# Patient Record
Sex: Female | Born: 1935
Health system: Southern US, Community
[De-identification: ages and names within clinical notes are randomized; demographics above are authoritative.]

## PROBLEM LIST (undated history)

## (undated) DIAGNOSIS — M542 Cervicalgia: Secondary | ICD-10-CM

## (undated) DIAGNOSIS — K589 Irritable bowel syndrome without diarrhea: Secondary | ICD-10-CM

## (undated) DIAGNOSIS — Z85828 Personal history of other malignant neoplasm of skin: Secondary | ICD-10-CM

## (undated) DIAGNOSIS — Z853 Personal history of malignant neoplasm of breast: Secondary | ICD-10-CM

## (undated) DIAGNOSIS — M810 Age-related osteoporosis without current pathological fracture: Secondary | ICD-10-CM

## (undated) DIAGNOSIS — I10 Essential (primary) hypertension: Secondary | ICD-10-CM

## (undated) DIAGNOSIS — R7303 Prediabetes: Secondary | ICD-10-CM

## (undated) DIAGNOSIS — E785 Hyperlipidemia, unspecified: Secondary | ICD-10-CM

## (undated) DIAGNOSIS — M1990 Unspecified osteoarthritis, unspecified site: Secondary | ICD-10-CM

## (undated) DIAGNOSIS — K2270 Barrett's esophagus without dysplasia: Secondary | ICD-10-CM

## (undated) HISTORY — DX: Irritable bowel syndrome, unspecified: K58.9

## (undated) HISTORY — PX: MASTECTOMY: SHX3

## (undated) HISTORY — DX: Personal history of other malignant neoplasm of skin: Z85.828

## (undated) HISTORY — PX: OTHER SURGICAL HISTORY: SHX169

## (undated) HISTORY — DX: Cervicalgia: M54.2

## (undated) HISTORY — DX: Hyperlipidemia, unspecified: E78.5

## (undated) HISTORY — PX: CATARACT EXTRACTION: SUR2

## (undated) HISTORY — DX: Prediabetes: R73.03

## (undated) HISTORY — DX: Age-related osteoporosis without current pathological fracture: M81.0

## (undated) HISTORY — PX: CHOLECYSTECTOMY: SHX55

## (undated) HISTORY — DX: Unspecified osteoarthritis, unspecified site: M19.90

## (undated) HISTORY — DX: Personal history of malignant neoplasm of breast: Z85.3

## (undated) HISTORY — PX: ABDOMINAL HYSTERECTOMY: SHX81

## (undated) HISTORY — PX: APPENDECTOMY: SHX54

## (undated) HISTORY — DX: Barrett's esophagus without dysplasia: K22.70

## (undated) HISTORY — PX: NEPHRECTOMY: SHX65

## (undated) HISTORY — DX: Essential (primary) hypertension: I10

---

## 1998-02-19 ENCOUNTER — Other Ambulatory Visit: Admission: RE | Admit: 1998-02-19 | Discharge: 1998-02-19 | Payer: Self-pay | Admitting: Internal Medicine

## 2000-01-09 ENCOUNTER — Encounter (INDEPENDENT_AMBULATORY_CARE_PROVIDER_SITE_OTHER): Payer: Self-pay | Admitting: *Deleted

## 2000-01-09 ENCOUNTER — Ambulatory Visit (HOSPITAL_BASED_OUTPATIENT_CLINIC_OR_DEPARTMENT_OTHER): Admission: RE | Admit: 2000-01-09 | Discharge: 2000-01-09 | Payer: Self-pay | Admitting: Surgery

## 2000-01-09 ENCOUNTER — Encounter: Payer: Self-pay | Admitting: Surgery

## 2000-01-16 ENCOUNTER — Encounter: Admission: RE | Admit: 2000-01-16 | Discharge: 2000-04-15 | Payer: Self-pay | Admitting: Radiation Oncology

## 2000-01-20 ENCOUNTER — Ambulatory Visit (HOSPITAL_BASED_OUTPATIENT_CLINIC_OR_DEPARTMENT_OTHER): Admission: RE | Admit: 2000-01-20 | Discharge: 2000-01-20 | Payer: Self-pay | Admitting: Surgery

## 2000-01-31 ENCOUNTER — Encounter: Payer: Self-pay | Admitting: Surgery

## 2000-02-03 ENCOUNTER — Encounter (INDEPENDENT_AMBULATORY_CARE_PROVIDER_SITE_OTHER): Payer: Self-pay

## 2000-02-03 ENCOUNTER — Inpatient Hospital Stay (HOSPITAL_COMMUNITY): Admission: RE | Admit: 2000-02-03 | Discharge: 2000-02-04 | Payer: Self-pay | Admitting: Surgery

## 2000-05-11 ENCOUNTER — Other Ambulatory Visit: Admission: RE | Admit: 2000-05-11 | Discharge: 2000-05-11 | Payer: Self-pay | Admitting: Surgery

## 2000-05-21 ENCOUNTER — Other Ambulatory Visit: Admission: RE | Admit: 2000-05-21 | Discharge: 2000-05-21 | Payer: Self-pay | Admitting: Internal Medicine

## 2000-07-06 ENCOUNTER — Ambulatory Visit (HOSPITAL_COMMUNITY): Admission: RE | Admit: 2000-07-06 | Discharge: 2000-07-06 | Payer: Self-pay | Admitting: *Deleted

## 2000-07-06 ENCOUNTER — Encounter (INDEPENDENT_AMBULATORY_CARE_PROVIDER_SITE_OTHER): Payer: Self-pay | Admitting: Specialist

## 2001-05-31 ENCOUNTER — Other Ambulatory Visit: Admission: RE | Admit: 2001-05-31 | Discharge: 2001-05-31 | Payer: Self-pay | Admitting: Internal Medicine

## 2001-07-28 ENCOUNTER — Encounter (INDEPENDENT_AMBULATORY_CARE_PROVIDER_SITE_OTHER): Payer: Self-pay | Admitting: Specialist

## 2001-07-28 ENCOUNTER — Ambulatory Visit (HOSPITAL_COMMUNITY): Admission: RE | Admit: 2001-07-28 | Discharge: 2001-07-28 | Payer: Self-pay | Admitting: *Deleted

## 2002-06-02 ENCOUNTER — Other Ambulatory Visit: Admission: RE | Admit: 2002-06-02 | Discharge: 2002-06-02 | Payer: Self-pay | Admitting: Internal Medicine

## 2002-10-03 ENCOUNTER — Ambulatory Visit (HOSPITAL_COMMUNITY): Admission: RE | Admit: 2002-10-03 | Discharge: 2002-10-03 | Payer: Self-pay | Admitting: *Deleted

## 2002-10-03 ENCOUNTER — Encounter (INDEPENDENT_AMBULATORY_CARE_PROVIDER_SITE_OTHER): Payer: Self-pay

## 2002-11-29 ENCOUNTER — Ambulatory Visit (HOSPITAL_COMMUNITY): Admission: RE | Admit: 2002-11-29 | Discharge: 2002-11-29 | Payer: Self-pay | Admitting: Internal Medicine

## 2002-11-29 ENCOUNTER — Encounter: Payer: Self-pay | Admitting: Internal Medicine

## 2003-07-20 ENCOUNTER — Other Ambulatory Visit: Admission: RE | Admit: 2003-07-20 | Discharge: 2003-07-20 | Payer: Self-pay | Admitting: Internal Medicine

## 2004-08-16 ENCOUNTER — Other Ambulatory Visit: Admission: RE | Admit: 2004-08-16 | Discharge: 2004-08-16 | Payer: Self-pay | Admitting: Internal Medicine

## 2005-04-14 ENCOUNTER — Ambulatory Visit (HOSPITAL_COMMUNITY): Admission: RE | Admit: 2005-04-14 | Discharge: 2005-04-14 | Payer: Self-pay | Admitting: *Deleted

## 2005-04-14 ENCOUNTER — Encounter (INDEPENDENT_AMBULATORY_CARE_PROVIDER_SITE_OTHER): Payer: Self-pay | Admitting: Specialist

## 2005-06-25 ENCOUNTER — Ambulatory Visit (HOSPITAL_COMMUNITY): Admission: RE | Admit: 2005-06-25 | Discharge: 2005-06-25 | Payer: Self-pay | Admitting: Surgery

## 2005-06-25 ENCOUNTER — Encounter (INDEPENDENT_AMBULATORY_CARE_PROVIDER_SITE_OTHER): Payer: Self-pay | Admitting: Specialist

## 2006-01-26 ENCOUNTER — Ambulatory Visit (HOSPITAL_COMMUNITY): Admission: RE | Admit: 2006-01-26 | Discharge: 2006-01-26 | Payer: Self-pay | Admitting: *Deleted

## 2007-06-18 ENCOUNTER — Ambulatory Visit (HOSPITAL_COMMUNITY): Admission: RE | Admit: 2007-06-18 | Discharge: 2007-06-18 | Payer: Self-pay | Admitting: *Deleted

## 2007-06-18 ENCOUNTER — Encounter (INDEPENDENT_AMBULATORY_CARE_PROVIDER_SITE_OTHER): Payer: Self-pay | Admitting: *Deleted

## 2007-06-27 ENCOUNTER — Ambulatory Visit: Payer: Self-pay | Admitting: Internal Medicine

## 2007-06-27 ENCOUNTER — Observation Stay (HOSPITAL_COMMUNITY): Admission: EM | Admit: 2007-06-27 | Discharge: 2007-06-27 | Payer: Self-pay | Admitting: Emergency Medicine

## 2009-12-19 ENCOUNTER — Encounter: Admission: RE | Admit: 2009-12-19 | Discharge: 2009-12-19 | Payer: Self-pay | Admitting: Internal Medicine

## 2011-01-09 ENCOUNTER — Other Ambulatory Visit: Payer: Self-pay | Admitting: Gastroenterology

## 2011-04-08 NOTE — H&P (Signed)
NAMEALLANAH, [name]              ACCOUNT NO.:  [id number]   MEDICAL RECORD NO.:  [id number]          PATIENT TYPE:  INP   LOCATION:  1403                         FACILITY:  [facility]   PHYSICIAN:  Gardiner Barefoot, MD    DATE OF BIRTH:  30-Mar-1929   DATE OF ADMISSION:  06/26/2007  DATE OF DISCHARGE:  06/27/2007                              HISTORY & PHYSICAL   PRIMARY CARE PHYSICIAN:  Dr. Pearson Grippe of [facility].   CHIEF COMPLAINT:  Chest pain.   HISTORY OF PRESENT ILLNESS:  This is a 77 year old female with a history  of GERD, hypertension and a remote history of tobacco abuse (quit in  1970) here with chest pain.  The patient reports it started at 9 p.m. in  the evening with acute onset, substernal and radiation to the left  shoulder.  The patient complains of no nausea, vomiting, diaphoresis or  radiation to any other location.  No pallor.  On arrival to the  emergency room, the patient reported only intermittent mild chest pain.  The patient reports, despite having a history of significant GERD,  including hiatal hernia, this particular pain is not similar to her  typical symptoms of GERD and has never experienced this before.   PAST MEDICAL HISTORY:  1. Hypertension.  2. GERD.  3. Hiatal hernia.  4. Osteoporosis.  5. Status post cholecystectomy.  6. Status post hysterectomy.  7. Status post mastectomy for DCIS.  8. Nephrectomy.  9. BTL.  10.Status post EGD on June 18, 2007, which was negative for dysplasia.   MEDICATIONS:  1. Diovan 160 mg daily.  2. Protonix p.o. daily.  3. Fosamax weekly.   DRUG ALLERGIES:  No known drug allergies.   SOCIAL HISTORY:  Former smoker, occasional alcohol, here with her  husband.   FAMILY HISTORY:  No history of early sudden MI in her family.   REVIEW OF SYSTEMS:  All systems reviewed and negative except as per HPI.   PHYSICAL EXAM:  Temperature is 98.2, pulse is 88, respirations 18, blood  pressure is 185/88 and O2 sats 95%  on room air.  GENERAL:  The patient is awake, alert and oriented x3 and appears in no  acute distress.  HEENT:  Anicteric.  CARDIOVASCULAR:  Regular rate and rhythm with no murmurs, rubs or  gallops.  LUNGS:  Clear to auscultation bilaterally.  ABDOMEN:  Soft, nontender, nondistended, positive bowel sounds, no  hepatosplenomegaly.  EXTREMITIES:  No cyanosis, clubbing or edema.   LABORATORY DATA:  CK MB 2.5, troponin less than 0.05, sodium 142,  potassium 3.6, chloride 109, bicarbonate 26, BUN 22, creatinine 1.07,  glucose 120, WBC 8.4, hemoglobin 12 and platelets 176.  Chest x-ray no  acute cardiopulmonary disease.   IMPRESSION:  1. Chest pain.  Will have the patient admitted for serial enzymes, EKG      in the morning and as needed, chest pain protocol and will need a      stress test either in the morning or soon as an outpatient.  For      any change, increase in cardiac markers  or arrhythmias or changes      on the EKG, will consult Cardiology.  2. Hypertension.  Will continue with the patient's Diovan.  3. Gastroesophageal reflux disease.  Will continue the patient's      antireflux medication.      Gardiner Barefoot, MD  Electronically Signed     RWC/MEDQ  D:  06/27/2007  T:  06/27/2007  Job:  [id number]   cc:   Massie Maroon, MD  Fax: [phone]

## 2011-04-08 NOTE — Op Note (Signed)
[name], [name]              ACCOUNT NO.:  [id number]   MEDICAL RECORD NO.:  [id number]          PATIENT TYPE:  AMB   LOCATION:  ENDO                         FACILITY:  [facility]   PHYSICIAN:  Georgiana Spinner, M.D.    DATE OF BIRTH:  14-Nov-1929   DATE OF PROCEDURE:  06/18/2007  DATE OF DISCHARGE:                               OPERATIVE REPORT   PROCEDURE:  Upper endoscopy.   INDICATIONS:  History of Barrett's esophagus and gastroesophageal  reflux.   ANESTHESIA:  Fentanyl 50 mcg, Versed 5 mg.   PROCEDURE IN DETAIL:  With the patient mildly sedated in the left  lateral decubitus position, the Pentax videoscopic endoscope was  inserted in the mouth and passed under direct vision through the  esophagus which appeared normal until we reached the distal esophagus  and the squamocolumnar junction could not be well seen.  So, I entered  into the stomach.  The fundus, body, antrum, duodenal bulb, and second  portion of the duodenum were visualized. From this point, the endoscope  was slowly withdrawn taking circumferential views of the duodenal mucosa  until the endoscope had been pulled back into the stomach and placed in  retroflexion to view the stomach from below. The endoscope was  straightened and withdrawn, taking circumferential views of the  remaining gastric and esophageal mucosa, stopping first in the fundus of  the stomach where a number of polyps were seen and photographed.  Biopsies were taken to sample them, although they looked like a fundic  gland polyps. We then pulled back into the distal esophagus and took  biopsies of areas that could be Barrett's esophagus and we withdrew the  endoscope taking circumferential views of the remaining esophageal  mucosa.  The patient's vital signs and pulse oximeter remained stable.  The patient tolerated the procedure well without apparent complications.   FINDINGS:  No clear cut evidence of Barrett's esophagus.  Biopsies taken  of  the squamocolumnar junction and fundic gland polyps.  Await biopsy  reports.  The patient will call me for results and follow-up with me as  an outpatient.           ______________________________  Georgiana Spinner, M.D.     GMO/MEDQ  D:  06/18/2007  T:  06/18/2007  Job:  [id number]

## 2011-04-11 NOTE — Op Note (Signed)
[facility]  Patient:    [name], [name]                       MRN: [id number] Proc. Date: 01/09/00 Adm. Date:  [id number] Attending:  Charlton Haws CC:         Janae Bridgeman. Eloise Harman., M.D.             [facility]                           Operative Report  CCS#:  [id number]  PREOPERATIVE DIAGNOSIS:  Left breast abnormality on mammogram, possible carcinoma.  POSTOPERATIVE DIAGNOSIS:  Left breast abnormality on mammogram, possible carcinoma - probable carcinoma.  OPERATION:  Needle guided excision of left breast mass.  SURGEON:  Currie Paris, M.D.  ANESTHESIA:  General.  CLINICAL HISTORY:  This patient is a 77 year old, whose recent mammogram shows n area of architectural distortion in the upper outer quadrant of the left breast, which was not palpable.  DESCRIPTION OF PROCEDURE:  Patient brought to the operating room, having had a guidewire placed adjacent and through the mass.  The patient was given general anesthesia and the breast was prepped and draped.  The guidewire entered at the  areolar margin at about the 11 oclock position and tracked superiorly and deep. I made an initial incision on the skin approximately  1.5 cm away from the guidewire entry site, so I had small amount of breast to tunnel through to get to the area in question.  I then began excising the tissue around the guidewire, tracking its approximate direction.  When I got to about 5 cm, I found a very hard area and backed up and took a little bit more tissue around that, trying to stay out of that, but was suspicious that this was carcinoma.  The deep margin of the excision was the chest wall and I oriented the margins for the pathologist.  The area in question was very close the superior margin, so once this was sent or specimen mammography, I went back and took an entirely new deep superior margin all the way from the skin down to the  muscle.  This was all done with cautery and again, the new margin was marked.  The wound was irrigated and hemostasis achieved. Throughout, I put four metal clips in, not to mark the entire excision area, but to put around the area where the tumor had been located, since this was still superior to the incision and I wanted to make sure that area was well localized if this being malignant and radiation therapy be utilized.  A final check was made for hemostasis.  Using 3-0 Vicryl to close the subcu, leaving a fairly significant cavity, which I thought would fill in nicely. Skin was closed with 4-0 Monocryl subcuticular and Steri-Strips.  Sterile dressing was applied.  The patient tolerated the procedure well.  There are no operative complications.  All counts were correct. DD:  01/09/00 TD:  01/09/00 Job: [id number]

## 2011-04-11 NOTE — Procedures (Signed)
[facility]  Patient:    [name], [name]                MRN: [id number] Proc. Date: 07/06/00 Adm. Date:  [id number] Attending:  Sabino Gasser                           Procedure Report  PROCEDURE:  Endoscopy.  ENDOSCOPIST:  Sabino Gasser, M.D.  INDICATIONS:  Rule out Barretts esophagus in a patient with reflux.  ANESTHESIA:  None further given.  DESCRIPTION OF PROCEDURE:  With the patient mildly sedated in the left lateral decubitus position, the Olympus videoscopic endoscope was inserted in the mouth and passed under direct vision through the esophagus into the stomach. The fundus, body, and antrum all were well-visualized and appeared normal. The endoscope was then placed on retroflexion to view the stomach from below and this too appeared normal.  The endoscope was straightened and then advanced through the duodenal bulb and second portion of the duodenum, both of which appeared normal and were photographed.  The endoscope was then slowly withdrawn, taking circumferential views of the entire gastric and subsequently esophageal mucosa, stopping in the distal esophagus to photograph the GE junction.  There was no evidence of Barretts, _____ I could tell there was an area however that looked slightly inflamed, possibly gastritis or possibly even intestinal neoplasia.  This was photographed as well and biopsied.  The endoscope was then withdrawn, taking circumferential views of the remaining esophageal mucosa which otherwise appeared normal.  The patients vital signs and pulse oximeter remained stable.  The patient tolerated the procedure well and without apparent complications.  FINDINGS:  Await biopsy report of distal esophagus and proximal stomach.  The patient will call me for results and follow up with me as an outpatient. DD:  07/06/00 TD:  07/07/00 Job: [id number]

## 2011-04-11 NOTE — Procedures (Signed)
[facility]  Patient:    [name], [name] Visit Number: [id number] MRN: [id number]          Service Type: END Location: ENDO Attending Physician:  Sabino Gasser Proc. Date: 07/28/01 Admit Date:  07/28/2001                             Procedure Report  PROCEDURE:  Upper endoscopy with biopsy.  INDICATIONS:  Barretts esophagus.  ANESTHESIA:  Demerol 40 mg, Versed 5 mg.  DESCRIPTION OF PROCEDURE:  With the patient mildly sedated in the left lateral decubitus position, the Olympus videoscopic endoscope was inserted into the mouth and passed under direct vision through the esophagus which showed changes of Barretts esophagus, photographed in a short segment.  We biopsied this area and other areas of presumptive Barretts esophagus.  We entered into the stomach.  Fundus, body, antrum, duodenal bulb, and second portion of the duodenum were all visualized and appeared normal and were photographed.  From this point, the endoscope was slowly withdrawn, taking circumferential views of the entire duodenal mucosa until the endoscope then pulled back in the stomach and placed in retroflexion to view the stomach from below, and a hiatal hernia was seen and photographed.  The endoscope was then straightened and withdrawn, taking circumferential views of the remaining gastric and esophageal mucosa.  The patients vital signs and pulse oximeter remained stable.  The patient tolerated the procedure well without apparent complications.  FINDINGS:  Hiatal hernia, Barretts esophagus.  PLAN:  Await biopsy report.  The patient will call me for the results and follow up with me as an outpatient. Attending Physician:  Sabino Gasser DD:  07/28/01 TD:  07/28/01 Job: [id number]

## 2011-04-11 NOTE — Op Note (Signed)
   NAME:  [name], [name]                        ACCOUNT NO.:  [id number]   MEDICAL RECORD NO.:  [id number]                   PATIENT TYPE:  AMB   LOCATION:  ENDO                                 FACILITY:  [facility]   PHYSICIAN:  Georgiana Spinner, M.D.                 DATE OF BIRTH:  1929/05/20   DATE OF PROCEDURE:  10/03/2002  DATE OF DISCHARGE:                                 OPERATIVE REPORT   PROCEDURE:  Upper endoscopy with biopsy.   INDICATIONS:  GERD.  Barrett's esophagus.   ANESTHESIA:  Demerol 50, Versed 4 mg.   DESCRIPTION OF PROCEDURE:  With the patient mildly sedated in the left  lateral decubitus position, the Olympus videoscopic endoscope was inserted  in the mouth and passed under direct vision through the esophagus, which  appeared normal, except for a question of Barrett's esophagus that was  photographed and subsequently biopsied.  We then entered into the stomach.  Fundus, body, antrum, duodenal bulb, and second portion of duodenum were  visualized.  From this point, the endoscope was slowly withdrawn, taking  circumferential views of the duodenal mucosa until the endoscope then pulled  back into the stomach, placed in retroflexion to view the stomach from  below, and a hiatal hernia was seen and photographed.  The endoscope was  then straightened and withdrawn, taking circumferential views of the  remaining gastric and esophageal mucosa, stopping in the antrum where there  were some mucosal changes in the prepyloric area that were photographed and  biopsied.  Polyps were seen in the body, and they were photographed and  biopsied.  And, as noted previously, the distal esophagus was biopsied.  The  patient's vital signs and pulse oximeter remained stable.  The patient  tolerated the procedure well without apparent complications.   FINDINGS:  As above, antral mucosal changes, etiology unclear.  Polyps of  the body of the stomach and question of Barrett's.   PLAN:  1.  Await biopsy report.  2. The patient will call me for results and follow up with me as an     outpatient.                                               Georgiana Spinner, M.D.    GMO/MEDQ  D:  10/03/2002  T:  10/03/2002  Job:  [id number]

## 2011-04-11 NOTE — Op Note (Signed)
[name], [name]              ACCOUNT NO.:  [id number]   MEDICAL RECORD NO.:  [id number]          PATIENT TYPE:  OIB   LOCATION:  2899                         FACILITY:  [facility]   PHYSICIAN:  Currie Paris, M.D.DATE OF BIRTH:  1928-11-29   DATE OF PROCEDURE:  06/25/2005  DATE OF DISCHARGE:                                 OPERATIVE REPORT   PREOPERATIVE DIAGNOSIS:  Basal cell, left shin.   POSTOPERATIVE DIAGNOSIS:  Basal cell, left shin.   OPERATION:  Wide local excision basal cell carcinoma,  left anterior shin.   SURGEON:  Currie Paris, M.D.   ASSISTANT:  Chales Abrahams [place], [place] student   ANESTHESIA:  Local.   CLINICAL HISTORY:  Ms. Dame has had a basal cell low grade removed from  anterior thigh but needed wider excision for margin control.   DESCRIPTION OF PROCEDURE:  The patient was seen in the minor procedure room  and the lesion identified and marked. The area was prepped with some alcohol  and anesthetized with 1% Xylocaine with epi. I waited about 10 minutes and  then the area was prepped and draped as sterile field. I outlined an  elliptical incision longitudinally oriented such that I got a 5 mm margin  around it in all directions. This meant that the incision was approximately  15 mm wide by approximately 35 long.   I made the skin incision and excised skin, subcutaneous tissue down to  superficial fascia using the knife. I put orienting sutures on the skin for  the pathology.   Everything appeared to be dry. This was closed in layers with 3-0 Vicryl  followed by 4-0 Monocryl subcuticular and Steri-Strips. The patient  tolerated procedure well.       CJS/MEDQ  D:  06/25/2005  T:  06/25/2005  Job:  [id number]   cc:   Hope M. Danella Deis, M.D.  [address]  [place]  [place] [address]  Fax: [phone]

## 2011-04-11 NOTE — Op Note (Signed)
NAMESOJOURNER, BEHRINGER              ACCOUNT NO.:  [id number]   MEDICAL RECORD NO.:  [id number]          PATIENT TYPE:  AMB   LOCATION:  ENDO                         FACILITY:  [facility]   PHYSICIAN:  Georgiana Spinner, M.D.    DATE OF BIRTH:  [birth year]   DATE OF PROCEDURE:  04/14/2005  DATE OF DISCHARGE:                                 OPERATIVE REPORT   PROCEDURE:  Upper endoscopy.   INDICATIONS:  Gastroesophageal reflux disease.   ANESTHESIA:  Demerol 50, Versed 5 mg.   DESCRIPTION OF PROCEDURE:  With the patient mildly sedated in the left  lateral decubitus position, the Olympus videoscopic endoscope was inserted  in the mouth and passed under direct vision through the esophagus which  appeared normal until we reached the distal esophagus and there were some  changes of Barrett's photographed and biopsied. We entered into the stomach.  The fundus and body appeared normal. Antrum showed some mild erosions which  were photographed and biopsied. The duodenal bulb and second portion  duodenum appeared normal. From this point, the endoscope was slowly  withdrawn taking circumferential views of duodenal mucosa until the  endoscope had been pulled back into the stomach, placed in retroflexion to  view the stomach from below and a widely patent GE junction was noted and  the esophagus could be seen cephalad. From this point, the endoscope was  then straightened and withdrawn taking circumferential views of the  remaining gastric and esophageal mucosa. The patient's vital signs and pulse  oximeter remained stable. The patient tolerated the procedure well without  apparent complications.   FINDINGS:  Changes of Barrett's esophagus,  antral erosions, await biopsy  report. The patient will call me for results and follow-up with me as an  outpatient.      GMO/MEDQ  D:  04/14/2005  T:  04/14/2005  Job:  [id number]

## 2011-04-11 NOTE — Procedures (Signed)
[facility]  Patient:    [name], [name]                MRN: [id number] Proc. Date: 07/06/00 Adm. Date:  [id number] Attending:  Sabino Gasser                           Procedure Report  PROCEDURE:  Colonoscopy.  ENDOSCOPIST:  Sabino Gasser, M.D.  INDICATIONS:  Colon cancer screening.  Patient with recent breast cancer that makes her an increased risk for colon cancer.  ANESTHESIA:  Demerol 70 mg and Versed 7 mg was given intravenously in divided dose.  DESCRIPTION OF PROCEDURE:  With the patient mildly sedated in the left lateral decubitus position, the Olympus videoscopic pediatric colonoscope was inserted in the rectum and passed under direct vision to the cecum.  The cecum was identified by the ileocecal and the appendiceal orifice, both of which were photographed.  From this point, the colonoscope was slowly withdrawn, taking circumferential views of the entire colonic mucosa, stopping in the rectum which appeared normal on direct and retroflexed view.  The endoscope was straightened and withdrawn.  The patients vital signs and pulse oximeter remained stable.  The patient tolerated the procedure well without apparent complications.  FINDINGS:  Grossly normal colonoscopic examination.  PLAN:  Proceed to endoscopy to rule out Barretts in a patient with chronic reflux. DD:  07/06/00 TD:  07/07/00 Job: [id number]

## 2011-04-11 NOTE — Op Note (Signed)
NAMEMAYCE, NOYES              ACCOUNT NO.:  [id number]   MEDICAL RECORD NO.:  [id number]          PATIENT TYPE:  AMB   LOCATION:  ENDO                         FACILITY:  [facility]   PHYSICIAN:  Georgiana Spinner, M.D.    DATE OF BIRTH:  [birth year]   DATE OF PROCEDURE:  01/26/2006  DATE OF DISCHARGE:                                 OPERATIVE REPORT   PROCEDURE:  Colonoscopy.   INDICATIONS:  Colon cancer screening.   ANESTHESIA:  Demerol 70, Versed 8 milligrams.   PROCEDURE:  With the patient mildly sedated in the left lateral decubitus  position the Olympus videoscopic colonoscope was inserted in the rectum and  passed under direct vision to the cecum, identified by ileocecal valve and  base of cecum, both which were photographed.  From this point colonoscope  was slowly withdrawn taking circumferential views of colonic mucosa stopping  only in the rectum which appeared normal on direct showed hemorrhoids on  retroflexed view.  The endoscope was straightened and withdrawn. The  patient's vital signs, pulse oximeter remained stable. The patient tolerated  procedure well without apparent complications.   FINDINGS:  Internal hemorrhoids otherwise unremarkable colonoscopic  examination to cecum.   PLAN:  Consider repeat examination in 5-10 years           ______________________________  Georgiana Spinner, M.D.     GMO/MEDQ  D:  01/26/2006  T:  01/26/2006  Job:  [id number]

## 2011-09-08 LAB — DIFFERENTIAL
Eosinophils Absolute: 0.3
Eosinophils Relative: 3
Lymphs Abs: 1.9

## 2011-09-08 LAB — POCT CARDIAC MARKERS
CKMB, poc: 1.8
Myoglobin, poc: 65.4
Operator id: 1192
Operator id: 1192
Troponin i, poc: <0.05

## 2011-09-08 LAB — BASIC METABOLIC PANEL
CO2: 26
Calcium: 10
Creatinine, Ser: 1.07
GFR calc Af Amer: >60

## 2011-09-08 LAB — CBC
HCT: 35.2 — ABNORMAL LOW
MCV: 88.2
Platelets: 176
WBC: 8.4

## 2011-12-23 ENCOUNTER — Other Ambulatory Visit: Payer: Self-pay | Admitting: Dermatology

## 2012-12-30 ENCOUNTER — Other Ambulatory Visit: Payer: Self-pay | Admitting: Dermatology

## 2014-02-17 ENCOUNTER — Ambulatory Visit
Admission: RE | Admit: 2014-02-17 | Discharge: 2014-02-17 | Disposition: A | Discharge status: Home / Self Care | Payer: Medicare Other | Source: Ambulatory Visit | Attending: Gastroenterology | Admitting: Gastroenterology

## 2014-02-17 ENCOUNTER — Other Ambulatory Visit: Payer: Self-pay | Admitting: Gastroenterology

## 2014-02-17 DIAGNOSIS — R109 Unspecified abdominal pain: Secondary | ICD-10-CM

## 2014-02-17 DIAGNOSIS — R1013 Epigastric pain: Secondary | ICD-10-CM

## 2014-02-17 DIAGNOSIS — R799 Abnormal finding of blood chemistry, unspecified: Secondary | ICD-10-CM

## 2015-08-13 ENCOUNTER — Ambulatory Visit (INDEPENDENT_AMBULATORY_CARE_PROVIDER_SITE_OTHER): Payer: Medicare Other | Admitting: Neurology

## 2015-08-13 ENCOUNTER — Encounter: Payer: Self-pay | Admitting: Neurology

## 2015-08-13 VITALS — BP 172/79 | HR 78 | Ht 63.0 in | Wt 133.0 lb

## 2015-08-13 DIAGNOSIS — M542 Cervicalgia: Secondary | ICD-10-CM | POA: Diagnosis not present

## 2015-08-13 MED ORDER — CELECOXIB 100 MG PO CAPS: 100.0000 mg | ORAL_CAPSULE | Freq: Two times a day (BID) | ORAL | Status: DC

## 2015-08-13 NOTE — Progress Notes (Signed)
PATIENT: [name] DOB: 1929/04/27  Chief Complaint  Patient presents with  . Neck Pain    She started having neck pain nine months ago.  She has failed medications, phyical therapy and chiropractic care.  She has difficulty turning her head side to side.  It is easier to look to the right.     HISTORICAL  [name] is a 81 years old right-handed female, seen in refer by her primary care physician Dr. 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, ER, PR positive, status post left mastectomy, Barrett's esophagitis, history of left nephrectomy due to bleeding  She presented with subacute onset of neck pain since January 2016, she went to dinner with her family, which require her constantly turning and looking towards the left side, the next morning, she woke up and noticed severe neck pain, more on the left side, radiating to her skull, initially this was treated with physical therapy, heat, ice pack, which helped her some, it has become much worse since April 2016, after she help her daughter on pack, in July 2016, she described severe neck pain, difficulty moving her neck, has to constantly hold her neck to the left tilt position, worsening neck pain with any direction, especially looking up or down, turning to the left side, she had chiropractor treatment, with limited help, she is taking ibuprofen as needed which is helpful, but she is concerned about GI and long-term renal side effect.  She denies radiating pain from neck to her arm or shoulders, she denies gait difficulty, she denies bowel and bladder incontinence.  REVIEW OF SYSTEMS: Full 14 system review of systems performed and notable only for headaches, easy bruising, achy muscles, allergy, runny nose  ALLERGIES: No Known Allergies  HOME MEDICATIONS: Current  Outpatient Prescriptions  Medication Sig Dispense Refill  . Ascorbic Acid (VITAMIN C) 1000 MG tablet Take 1,000 mg by mouth daily.    Marland Kitchen aspirin EC 81 MG tablet Take 81 mg by mouth daily.    . Biotin (BIOTIN MAXIMUM STRENGTH) 10 MG TABS Take by mouth daily.    . Cholecalciferol (VITAMIN D-3 PO) Take by mouth daily.    . Magnesium 400 MG CAPS Take by mouth daily.    Marland Kitchen METFORMIN HCL ER PO Take by mouth. Take 250mg  daily.    . Omega-3 Fatty Acids (FISH OIL) 1000 MG CAPS Take by mouth daily.    . pantoprazole (PROTONIX) 40 MG tablet Take by mouth daily.    . pravastatin (PRAVACHOL) 20 MG tablet Take 20 mg by mouth daily.    . TURMERIC PO Take by mouth daily.    Marland Kitchen VALSARTAN-HYDROCHLOROTHIAZIDE PO Take by mouth daily.     No current facility-administered medications for this visit.    PAST MEDICAL HISTORY: Past Medical History  Diagnosis Date  . Hypertension   . HX: breast cancer   . Hx of skin cancer, basal cell   . Barrett esophagus   . IBS (irritable bowel syndrome)   . Osteoarthritis   . Osteoporosis   . Neck pain   . Hyperlipemia   . Prediabetes     PAST SURGICAL HISTORY: Past Surgical History  Procedure Laterality Date  . Nephrectomy Right     1955  . Mastectomy Left     2001  . Cholecystectomy  1975  . Cataract extraction Bilateral   . Basal cell carcinoma with focal sclerosis resection      Left shin  . Appendectomy    . Abdominal hysterectomy      FAMILY HISTORY: Family History  Problem Relation Age of Onset  . Congestive Heart Failure Mother   . Pneumonia Father   . Dementia Father     SOCIAL HISTORY:  Social History   Social History  . Marital Status: Married    Spouse Name: N/A  . Number of Children: 2  . Years of Education: 12   Occupational History  . Retired    Social History Main Topics  . Smoking status: Former Smoker    Types: Cigarettes  . Smokeless tobacco: Not on file     Comment: Quit 1970  . Alcohol Use: 0.0 oz/week    0  Standard drinks or equivalent per week     Comment: Occasional glass of wine  . Drug Use: No  . Sexual Activity: Not on file   Other Topics Concern  . Not on file   Social History Narrative   Lives at home with husband.   Right-handed.   2-3 cups caffeine per day.     PHYSICAL EXAM   Filed Vitals:   08/13/15 1311  BP: 172/79  Pulse: 78  Height: 5\' 3"  (1.6 m)  Weight: 133 lb (60.328 kg)    Not recorded      Body mass index is 23.57 kg/(m^2).  PHYSICAL EXAMNIATION:  Gen: NAD, conversant, well nourised, obese, well groomed                     Cardiovascular: Regular rate rhythm, no peripheral edema, warm, nontender. Eyes: Conjunctivae clear without exudates or hemorrhage Neck: Supple, no carotid bruise. Pulmonary: Clear to auscultation bilaterally   NEUROLOGICAL EXAM:  MENTAL STATUS: Speech:    Speech is normal; fluent and spontaneous with normal comprehension.  Cognition:     Orientation to time, place and person     Normal recent and remote memory     Normal Attention span and concentration     Normal Language, naming, repeating,spontaneous speech     Fund of knowledge   CRANIAL NERVES:  CN II: Visual fields are full to confrontation. Fundoscopic exam is normal with sharp discs and no vascular changes. Pupils are round equal and briskly reactive to light. CN III, IV, VI: extraocular movement are normal. No ptosis. CN V: Facial sensation is intact to pinprick in all 3 divisions bilaterally. Corneal responses are intact.  CN VII: Face is symmetric with normal eye closure and smile. CN VIII: Hearing is normal to rubbing fingers CN IX, X: Palate elevates symmetrically. Phonation is normal. CN XI: Shoulder shrug are intact, she has limited range of motion of her neck turning, worsening gait difficulty turning to her left side, tends to hold her neck in the left tilted position.  CN XII: Tongue is midline with normal movements and no atrophy.  MOTOR: There is no  pronator drift of out-stretched arms. Muscle bulk and tone are normal. Muscle strength is normal.  REFLEXES: Reflexes are 2+ and symmetric at the biceps, triceps, knees, and ankles. Plantar responses are flexor.  SENSORY: Intact to light touch, pinprick, position sense, and vibration sense are intact in fingers and toes.  COORDINATION: Rapid alternating movements and fine finger movements are intact. There is no dysmetria on finger-to-nose and heel-knee-shin.    GAIT/STANCE: Posture is normal. Gait is  steady with normal steps, base, arm swing, and turning. Heel and toe walking are normal. Tandem gait is normal.  Romberg is absent.   DIAGNOSTIC DATA (LABS, IMAGING, TESTING) - I reviewed patient records, labs, notes, testing and imaging myself where available.   ASSESSMENT AND PLAN  [name] is a 81 y.o. female with neck pain since January 2016, limited range of motion, normal neurological examination  Cervical pain, possible upper cervical radiculopathy, no evidence of myelopathy  Proceed with MRI of cervical spine without contrast  Heating pad,  Neck stretching exercise  As needed Celebrex 100 mg twice a day    Marcial Pacas, M.D. Ph.D.  [facility] [address] [place], [place] [address] Ph: [phone] Fax: [phone]  CC: Dr. Jani Gravel

## 2015-08-21 ENCOUNTER — Ambulatory Visit
Admission: RE | Admit: 2015-08-21 | Discharge: 2015-08-21 | Disposition: A | Discharge status: Home / Self Care | Payer: Medicare Other | Source: Ambulatory Visit | Attending: Neurology | Admitting: Neurology

## 2015-08-21 DIAGNOSIS — M542 Cervicalgia: Secondary | ICD-10-CM | POA: Diagnosis not present

## 2015-08-24 ENCOUNTER — Telehealth: Payer: Self-pay | Admitting: Neurology

## 2015-08-24 NOTE — Telephone Encounter (Signed)
I have spoken with [name] this morning and per Dr. Krista Blue, advised that mri of neck showed degen changes; Dr. Krista Blue will discuss in greater detail and review tx. options at f/u appt.  She verbalized understanding of same/fim

## 2015-08-24 NOTE — Telephone Encounter (Signed)
Please call patient, MRI cervical spine showed evidence of multilevel cervical DJD, most severe at C1, C2, evidence of foraminal stenosis at different level. I will go over detail with her at her follow up visit.   Abnormal MRI cervical spine (without) demonstrating: 1. There is T1 hypointense, STIR hyperintense signal within the C1 and C2 vertebral bodies anteriorly and towards the left side, with loss of lateral mass height (20-30%). There is increased STIR signal within the C1-2 articulation on the left side, may represent facet edema. No evidence of marrow expansion or extension of abnormal signal to the posterior elements. Findings may represent degenerative marrow edema and type 1 modic changes. Infectious or neoplastic etiologies are considered but less likely.  2. At C5-6: disc bulging and facet hypertrophy with moderate right and severe left foraminal stenosis  3. At C4-5: disc bulging and facet hypertrophy with moderate biforaminal stenosis 4. At C6-7: disc bulging and facet hypertrophy with mild left foraminal stenosis

## 2015-08-28 ENCOUNTER — Encounter: Payer: Self-pay | Admitting: Neurology

## 2015-08-28 ENCOUNTER — Ambulatory Visit (INDEPENDENT_AMBULATORY_CARE_PROVIDER_SITE_OTHER): Payer: Medicare Other | Admitting: Neurology

## 2015-08-28 VITALS — BP 180/85 | HR 72 | Ht 63.0 in | Wt 133.0 lb

## 2015-08-28 DIAGNOSIS — M542 Cervicalgia: Secondary | ICD-10-CM | POA: Diagnosis not present

## 2015-08-28 MED ORDER — OXYCODONE-ACETAMINOPHEN 5-325 MG PO TABS: 1.0000 | ORAL_TABLET | Freq: Three times a day (TID) | ORAL | Status: DC | PRN

## 2015-08-28 MED ORDER — PREDNISONE 10 MG PO TABS: ORAL_TABLET | ORAL | Status: DC

## 2015-08-28 NOTE — Progress Notes (Signed)
Chief Complaint  Patient presents with  . Neck Pain    She is here to discuss the results of her MRI.  Says heat, stretching and Celebrex has not been very helpful for her pain.      PATIENT: [name] DOB: September 23, 1929  Chief Complaint  Patient presents with  . Neck Pain    She is here to discuss the results of her MRI.  Says heat, stretching and Celebrex has not been very helpful for her pain.     HISTORICAL  [name] is a 81 years old right-handed female, seen in refer by her primary care physician Dr. 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, ER, PR positive, status post left mastectomy, Barrett's esophagitis, history of left nephrectomy due to bleeding  She presented with subacute onset of neck pain since January 2016, she went to dinner with her family, which require her constantly turning and looking towards the left side, the next morning, she woke up and noticed severe neck pain, more on the left side, radiating to her skull, initially this was treated with physical therapy, heat, ice pack, which helped her some, it has become much worse since April 2016, after she help her daughter on pack, in July 2016, she described severe neck pain, difficulty moving her neck, has to constantly hold her neck to the left tilt position, worsening neck pain with any direction, especially looking up or down, turning to the left side, she had chiropractor treatment, with limited help, she is taking ibuprofen as needed which is helpful, but she is concerned about GI and long-term renal side effect.  She denies radiating pain from neck to her arm or shoulders, she denies gait difficulty, she denies bowel and bladder incontinence.  Update August 28 2015: She continues to complains significant left-sided neck pain, difficulty turning  her neck, she has tried and failed physical therapy, hot compression, as needed NSAIDs. She complains of frequent left-sided headaches, radiating to left skull. She has tried Celebrex without helping  I have reviewed MRI cervical spine film with patient  1. There is T1 hypointense, STIR hyperintense signal within the C1 and C2 vertebral bodies anteriorly and towards the left side, with loss of lateral mass height (20-30%). There is increased STIR signal within the C1-2 articulation on the left side, may represent facet edema. No evidence of marrow expansion or extension of abnormal signal to the posterior elements. Findings may represent degenerative marrow edema and type 1 modic changes. Infectious or neoplastic etiologies are considered but less likely.  2. At C5-6: disc bulging and facet hypertrophy with moderate right and severe left foraminal stenosis  3. At C4-5: disc bulging and facet hypertrophy with moderate biforaminal stenosis 4. At C6-7: disc bulging and facet hypertrophy with mild left foraminal stenosis   REVIEW OF SYSTEMS: Full 14 system review of systems performed and notable only for left-sided headaches, environmental allergy, bruise easily, anxiety, neck pain, muscle cramps, frequent awakening   ALLERGIES: No Known Allergies  HOME MEDICATIONS: Current Outpatient Prescriptions  Medication Sig Dispense Refill  . Ascorbic Acid (VITAMIN C) 1000 MG tablet Take 1,000 mg by mouth daily.    Marland Kitchen aspirin EC 81 MG tablet Take 81 mg by mouth daily.    . Biotin (BIOTIN MAXIMUM STRENGTH) 10 MG TABS Take by mouth daily.    Marland Kitchen  celecoxib (CELEBREX) 100 MG capsule Take 1 capsule (100 mg total) by mouth 2 (two) times daily. 60 capsule 3  . Cholecalciferol (VITAMIN D-3 PO) Take by mouth daily.    . Magnesium 400 MG CAPS Take by mouth daily.    Marland Kitchen METFORMIN HCL ER PO Take by mouth. Take 250mg  daily.    . Omega-3 Fatty Acids (FISH OIL) 1000 MG CAPS Take by mouth daily.    . pantoprazole  (PROTONIX) 40 MG tablet Take by mouth daily.    . pravastatin (PRAVACHOL) 20 MG tablet Take 20 mg by mouth daily.    . TURMERIC PO Take by mouth daily.    Marland Kitchen VALSARTAN-HYDROCHLOROTHIAZIDE PO Take by mouth daily.     No current facility-administered medications for this visit.    PAST MEDICAL HISTORY: Past Medical History  Diagnosis Date  . Hypertension   . HX: breast cancer   . Hx of skin cancer, basal cell   . Barrett esophagus   . IBS (irritable bowel syndrome)   . Osteoarthritis   . Osteoporosis   . Neck pain   . Hyperlipemia   . Prediabetes     PAST SURGICAL HISTORY: Past Surgical History  Procedure Laterality Date  . Nephrectomy Right     1955  . Mastectomy Left     2001  . Cholecystectomy      1975  . Cataract extraction Bilateral   . Basal cell carcinoma with focal sclerosis resection      Left shin  . Appendectomy    . Abdominal hysterectomy      FAMILY HISTORY: Family History  Problem Relation Age of Onset  . Congestive Heart Failure Mother   . Pneumonia Father   . Dementia Father     SOCIAL HISTORY:  Social History   Social History  . Marital Status: Married    Spouse Name: N/A  . Number of Children: 2  . Years of Education: 12   Occupational History  . Retired    Social History Main Topics  . Smoking status: Former Smoker    Types: Cigarettes  . Smokeless tobacco: Not on file     Comment: Quit 1970  . Alcohol Use: 0.0 oz/week    0 Standard drinks or equivalent per week     Comment: Occasional glass of wine  . Drug Use: No  . Sexual Activity: Not on file   Other Topics Concern  . Not on file   Social History Narrative   Lives at home with husband.   Right-handed.   2-3 cups caffeine per day.     PHYSICAL EXAM   Filed Vitals:   08/28/15 1545  BP: 180/85  Pulse: 72  Height: 5\' 3"  (1.6 m)  Weight: 133 lb (60.328 kg)    Not recorded      Body mass index is 23.57 kg/(m^2).  PHYSICAL EXAMNIATION:  Gen: NAD,  conversant, well nourised, obese, well groomed                     Cardiovascular: Regular rate rhythm, no peripheral edema, warm, nontender. Eyes: Conjunctivae clear without exudates or hemorrhage Neck: Supple, no carotid bruise. Pulmonary: Clear to auscultation bilaterally   NEUROLOGICAL EXAM:  MENTAL STATUS: Speech:    Speech is normal; fluent and spontaneous with normal comprehension.  Cognition:     Orientation to time, place and person     Normal recent and remote memory     Normal Attention span and concentration  Normal Language, naming, repeating,spontaneous speech     Fund of knowledge   CRANIAL NERVES:  CN II: Visual fields are full to confrontation. Fundoscopic exam is normal with sharp discs and no vascular changes. Pupils are round equal and briskly reactive to light. CN III, IV, VI: extraocular movement are normal. No ptosis. CN V: Facial sensation is intact to pinprick in all 3 divisions bilaterally. Corneal responses are intact.  CN VII: Face is symmetric with normal eye closure and smile. CN VIII: Hearing is normal to rubbing fingers CN IX, X: Palate elevates symmetrically. Phonation is normal. CN XI: Shoulder shrug are intact, she has limited range of motion of her neck turning, worsening gait difficulty turning to her left side, tends to hold her neck in the left tilted position.  tenderness of left upper cervical region, along left nuchal line CN XII: Tongue is midline with normal movements and no atrophy.  MOTOR: There is no pronator drift of out-stretched arms. Muscle bulk and tone are normal. Muscle strength is normal.  REFLEXES: Reflexes are 2+ and symmetric at the biceps, triceps, knees, and ankles. Plantar responses are flexor.  SENSORY: Intact to light touch, pinprick, position sense, and vibration sense are intact in fingers and toes.  COORDINATION: Rapid alternating movements and fine finger movements are intact. There is no dysmetria on  finger-to-nose and heel-knee-shin.    GAIT/STANCE: Posture is normal. Gait is steady with normal steps, base, arm swing, and turning. Heel and toe walking are normal. Tandem gait is normal.  Romberg is absent.   DIAGNOSTIC DATA (LABS, IMAGING, TESTING) - I reviewed patient records, labs, notes, testing and imaging myself where available.   ASSESSMENT AND PLAN  IMUNIQUE SAMAD is a 81 y.o. female with neck pain since January 2016, limited range of motion, normal neurological examination  Cervical pain  evidence of left C1, C2 lateral mass abnormality, likely inflammatory facet disease   refer her to pain management for possible fluoroscopy guided injection  Prednisone tapering dose, percocet prn  Hot compression compression   Marcial Pacas, M.D. Ph.D.  [facility] [address], [place], [place] [address] Ph: [phone] Fax: [phone]  CC: Dr. Jani Gravel

## 2015-08-30 ENCOUNTER — Ambulatory Visit: Payer: Medicare Other | Admitting: Neurology

## 2015-10-01 ENCOUNTER — Ambulatory Visit (INDEPENDENT_AMBULATORY_CARE_PROVIDER_SITE_OTHER): Payer: Medicare Other | Admitting: Neurology

## 2015-10-01 ENCOUNTER — Encounter: Payer: Self-pay | Admitting: Neurology

## 2015-10-01 VITALS — BP 142/66 | HR 76 | Ht 63.0 in | Wt 136.0 lb

## 2015-10-01 DIAGNOSIS — M542 Cervicalgia: Secondary | ICD-10-CM | POA: Diagnosis not present

## 2015-10-01 MED ORDER — HYDROCODONE-ACETAMINOPHEN 5-325 MG PO TABS: 1.0000 | ORAL_TABLET | Freq: Four times a day (QID) | ORAL | Status: DC | PRN

## 2015-10-01 NOTE — Progress Notes (Signed)
Chief Complaint  Patient presents with  . Neck Pain    She had an ESI on 09/19/15 but has not felt much relief from the injection.  She was instructed to hold her oral prednisone due to the [facility].  She rarely takes the oxycodone because it makes her feel too sedated.      PATIENT: [name] DOB: [birth year]  Chief Complaint  Patient presents with  . Neck Pain    She had an ESI on 09/19/15 but has not felt much relief from the injection.  She was instructed to hold her oral prednisone due to the [facility].  She rarely takes the oxycodone because it makes her feel too sedated.     HISTORICAL  [name] is a 81 years old right-handed female, seen in refer by her primary care physician Dr. 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, ER, PR positive, status post left mastectomy, Barrett's esophagitis, history of left nephrectomy due to bleeding  She presented with subacute onset of neck pain since January 2016, she went to dinner with her family, which require her constantly turning and looking towards the left side, the next morning, she woke up and noticed severe neck pain, more on the left side, radiating to her skull, initially this was treated with physical therapy, heat, ice pack, which helped her some, it has become much worse since April 2016, after she help her daughter on pack, in July 2016, she described severe neck pain, difficulty moving her neck, has to constantly hold her neck to the left tilt position, worsening neck pain with any direction, especially looking up or down, turning to the left side, she had chiropractor treatment, with limited help, she is taking ibuprofen as needed which is helpful, but she is concerned about GI and long-term renal side effect.  She denies radiating pain from neck to her arm or shoulders,  she denies gait difficulty, she denies bowel and bladder incontinence.  Update August 28 2015: She continues to complains significant left-sided neck pain, difficulty turning her neck, she has tried and failed physical therapy, hot compression, as needed NSAIDs. She complains of frequent left-sided headaches, radiating to left skull. She has tried Celebrex without helping  I have reviewed MRI cervical spine film with patient  1. There is T1 hypointense, STIR hyperintense signal within the C1 and C2 vertebral bodies anteriorly and towards the left side, with loss of lateral mass height (20-30%). There is increased STIR signal within the C1-2 articulation on the left side, may represent facet edema. No evidence of marrow expansion or extension of abnormal signal to the posterior elements. Findings may represent degenerative marrow edema and type 1 modic changes. Infectious or neoplastic etiologies are considered but less likely.  2. At C5-6: disc bulging and facet hypertrophy with moderate right and severe left foraminal stenosis  3. At C4-5: disc bulging and facet hypertrophy with moderate biforaminal stenosis 4. At C6-7: disc bulging and facet hypertrophy with mild left foraminal stenosis  UPDATE Oct 01 2015:  She took prednisone tapering since last visit August 28 2015, also had left upper cervical epidural injection, she reported moderate improvement, but continue has almost consistent mild to moderate left upper cervical pain, getting worse with neck movement, she denies fever, no upper or lower extremity weakness, no sensory loss, Celebrex helps her  some, she denies significant side effect, Percocet make her drowsy  REVIEW OF SYSTEMS: Full 14 system review of systems performed and notable only for left-sided headaches ALLERGIES: No Known Allergies  HOME MEDICATIONS: Current Outpatient Prescriptions  Medication Sig Dispense Refill  . Ascorbic Acid (VITAMIN C) 1000 MG tablet Take 1,000 mg by  mouth daily.    Marland Kitchen aspirin EC 81 MG tablet Take 81 mg by mouth daily.    . Biotin (BIOTIN MAXIMUM STRENGTH) 10 MG TABS Take by mouth daily.    . celecoxib (CELEBREX) 100 MG capsule Take 1 capsule (100 mg total) by mouth 2 (two) times daily. 60 capsule 3  . Cholecalciferol (VITAMIN D-3 PO) Take by mouth daily.    . Magnesium 400 MG CAPS Take by mouth daily.    Marland Kitchen METFORMIN HCL ER PO Take by mouth. Take 250mg  daily.    . Omega-3 Fatty Acids (FISH OIL) 1000 MG CAPS Take by mouth daily.    Marland Kitchen oxyCODONE-acetaminophen (PERCOCET) 5-325 MG tablet Take 1 tablet by mouth every 8 (eight) hours as needed for severe pain. 90 tablet 0  . pantoprazole (PROTONIX) 40 MG tablet Take by mouth daily.    . pravastatin (PRAVACHOL) 20 MG tablet Take 20 mg by mouth daily.    . predniSONE (DELTASONE) 10 MG tablet One po qam x2 week, then 1/2 tab po qam 60 tablet 0  . TURMERIC PO Take by mouth daily.    Marland Kitchen VALSARTAN-HYDROCHLOROTHIAZIDE PO Take by mouth daily.     No current facility-administered medications for this visit.    PAST MEDICAL HISTORY: Past Medical History  Diagnosis Date  . Hypertension   . HX: breast cancer   . Hx of skin cancer, basal cell   . Barrett esophagus   . IBS (irritable bowel syndrome)   . Osteoarthritis   . Osteoporosis   . Neck pain   . Hyperlipemia   . Prediabetes     PAST SURGICAL HISTORY: Past Surgical History  Procedure Laterality Date  . Nephrectomy Right     1955  . Mastectomy Left     2001  . Cholecystectomy      1975  . Cataract extraction Bilateral   . Basal cell carcinoma with focal sclerosis resection      Left shin  . Appendectomy    . Abdominal hysterectomy      FAMILY HISTORY: Family History  Problem Relation Age of Onset  . Congestive Heart Failure Mother   . Pneumonia Father   . Dementia Father     SOCIAL HISTORY:  Social History   Social History  . Marital Status: Married    Spouse Name: N/A  . Number of Children: 2  . Years of Education:  12   Occupational History  . Retired    Social History Main Topics  . Smoking status: Former Smoker    Types: Cigarettes  . Smokeless tobacco: Not on file     Comment: Quit 1970  . Alcohol Use: 0.0 oz/week    0 Standard drinks or equivalent per week     Comment: Occasional glass of wine  . Drug Use: No  . Sexual Activity: Not on file   Other Topics Concern  . Not on file   Social History Narrative   Lives at home with husband.   Right-handed.   2-3 cups caffeine per day.     PHYSICAL EXAM   Filed Vitals:   10/01/15 0841  BP: 142/66  Pulse: 76  Height:  5\' 3"  (1.6 m)  Weight: 136 lb (61.689 kg)    Not recorded      Body mass index is 24.1 kg/(m^2).  PHYSICAL EXAMNIATION:  Gen: NAD, conversant, well nourised, obese, well groomed                     Cardiovascular: Regular rate rhythm, no peripheral edema, warm, nontender. Eyes: Conjunctivae clear without exudates or hemorrhage Neck: Supple, no carotid bruise. Pulmonary: Clear to auscultation bilaterally   NEUROLOGICAL EXAM:  MENTAL STATUS: Speech:    Speech is normal; fluent and spontaneous with normal comprehension.  Cognition:     Orientation to time, place and person     Normal recent and remote memory     Normal Attention span and concentration     Normal Language, naming, repeating,spontaneous speech     Fund of knowledge   CRANIAL NERVES:  CN II: Visual fields are full to confrontation. Fundoscopic exam is normal with sharp discs and no vascular changes. Pupils are round equal and briskly reactive to light. CN III, IV, VI: extraocular movement are normal. No ptosis. CN V: Facial sensation is intact to pinprick in all 3 divisions bilaterally. Corneal responses are intact.  CN VII: Face is symmetric with normal eye closure and smile. CN VIII: Hearing is normal to rubbing fingers CN IX, X: Palate elevates symmetrically. Phonation is normal. CN XI: Shoulder shrug are intact, she has limited range of  motion of her neck turning, worsening gait difficulty turning to her left side, tends to hold her neck in the left tilted position.  tenderness of left upper cervical region, along left nuchal line CN XII: Tongue is midline with normal movements and no atrophy.  MOTOR: There is no pronator drift of out-stretched arms. Muscle bulk and tone are normal. Muscle strength is normal.  REFLEXES: Reflexes are 2+ and symmetric at the biceps, triceps, knees, and ankles. Plantar responses are flexor.  SENSORY: Intact to light touch, pinprick, position sense, and vibration sense are intact in fingers and toes.  COORDINATION: Rapid alternating movements and fine finger movements are intact. There is no dysmetria on finger-to-nose and heel-knee-shin.    GAIT/STANCE: Posture is normal. Gait is steady with normal steps, base, arm swing, and turning. Heel and toe walking are normal. Tandem gait is normal.  Romberg is absent.   DIAGNOSTIC DATA (LABS, IMAGING, TESTING) - I reviewed patient records, labs, notes, testing and imaging myself where available.   ASSESSMENT AND PLAN  RICKIA FREEBURG is a 81 y.o. female with neck pain since January 2016, limited range of motion, normal neurological examination  Cervical pain  evidence of left C1, C2 lateral mass abnormality, likely inflammatory facet disease   She had left cervical fluoroscopy guided epidural injection with mild improvement  Celebrex as needed , continue hot compression   hydrocodone as needed  Return to clinic in 2 months, if she continue has significant pain, may consider repeat MRI of cervical region   Marcial Pacas, M.D. Ph.D.  [facility] [address] [place], [place] [address] Ph: [phone] Fax: [phone]  CC: Dr. Jani Gravel

## 2015-11-27 ENCOUNTER — Telehealth: Payer: Self-pay | Admitting: Neurology

## 2015-11-27 NOTE — Telephone Encounter (Signed)
Per Dr. Krista Blue, ok to return to Dr. Ace Gins for an additional injection. She will keep her appt here on 12/05/15.

## 2015-11-27 NOTE — Telephone Encounter (Signed)
Patient called regarding 12/05/15 appointment with Dr. Krista Blue, "wonders if she could go on back to Dr. Ace Gins for another shot".

## 2015-12-05 ENCOUNTER — Ambulatory Visit (INDEPENDENT_AMBULATORY_CARE_PROVIDER_SITE_OTHER): Payer: Medicare Other | Admitting: Neurology

## 2015-12-05 ENCOUNTER — Encounter: Payer: Self-pay | Admitting: Neurology

## 2015-12-05 VITALS — BP 152/62 | HR 64 | Ht 63.0 in | Wt 135.0 lb

## 2015-12-05 DIAGNOSIS — M542 Cervicalgia: Secondary | ICD-10-CM | POA: Diagnosis not present

## 2015-12-05 NOTE — Progress Notes (Signed)
Chief Complaint  Patient presents with  . Neck Pain    She has continued to have neck pain.  She uses hydrocodone but sparingly because she does not like the side effects.  She had a repeat epidural steroid injection with Dr. Ace Gins on 12/03/15.    Chief Complaint  Patient presents with  . Neck Pain    She has continued to have neck pain.  She uses hydrocodone but sparingly because she does not like the side effects.  She had a repeat epidural steroid injection with Dr. Ace Gins on 12/03/15.      PATIENT: [name] DOB: 10-15-1929  Chief Complaint  Patient presents with  . Neck Pain    She has continued to have neck pain.  She uses hydrocodone but sparingly because she does not like the side effects.  She had a repeat epidural steroid injection with Dr. Ace Gins on 12/03/15.     HISTORICAL  [name] is a 82 years old right-handed female, seen in refer by her primary care physician Dr. 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, ER, PR positive, status post left mastectomy, Barrett's esophagitis, history of left nephrectomy due to bleeding  She presented with subacute onset of neck pain since January 2016, she went to dinner with her family, which require her constantly turning and looking towards the left side, the next morning, she woke up and noticed severe neck pain, more on the left side, radiating to her skull, initially this was treated with physical therapy, heat, ice pack, which helped her some, it has become much worse since April 2016, after she help her daughter on pack, in July 2016, she described severe neck pain, difficulty moving her neck, has to constantly hold her neck to the left tilt position, worsening neck pain with any direction, especially looking up or down, turning to the left side, she had chiropractor  treatment, with limited help, she is taking ibuprofen as needed which is helpful, but she is concerned about GI and long-term renal side effect.  She denies radiating pain from neck to her arm or shoulders, she denies gait difficulty, she denies bowel and bladder incontinence.  Update August 28 2015: She continues to complains significant left-sided neck pain, difficulty turning her neck, she has tried and failed physical therapy, hot compression, as needed NSAIDs. She complains of frequent left-sided headaches, radiating to left skull. She has tried Celebrex without helping  I have reviewed MRI cervical spine film with patient  1. There is T1 hypointense, STIR hyperintense signal within the C1 and C2 vertebral bodies anteriorly and towards the left side, with loss of lateral mass height (20-30%). There is increased STIR signal within the C1-2 articulation on the left side, may represent facet edema. No evidence of marrow expansion or extension of abnormal signal to the posterior elements. Findings may represent degenerative marrow edema and type 1 modic changes. Infectious or neoplastic etiologies are considered but less likely.  2. At C5-6: disc bulging and facet hypertrophy with moderate right and severe left foraminal stenosis  3. At C4-5: disc bulging and facet hypertrophy with moderate biforaminal stenosis 4. At C6-7: disc bulging and facet hypertrophy with mild left foraminal stenosis  UPDATE Oct 01 2015:  She took prednisone tapering since last visit August 28 2015, also had left upper cervical epidural injection,  she reported moderate improvement, but continue has almost consistent mild to moderate left upper cervical pain, getting worse with neck movement, she denies fever, no upper or lower extremity weakness, no sensory loss, Celebrex helps her some, she denies significant side effect, Percocet make her drowsy  UPDATE Jan 80 2016: She has 2nd epidural injection to her left upper neck,  which has helped her. She denies gait difficulty, denies bilateral upper extremity paresthesia or weakness. Before injection, she continued to have mild to moderate constant left-sided neck pain  REVIEW OF SYSTEMS: Full 14 system review of systems performed and notable only for left-sided headaches, frequent awakening, neck pain, neck stiffness  ALLERGIES: No Known Allergies  HOME MEDICATIONS: Current Outpatient Prescriptions  Medication Sig Dispense Refill  . Ascorbic Acid (VITAMIN C) 1000 MG tablet Take 1,000 mg by mouth daily.    Marland Kitchen aspirin EC 81 MG tablet Take 81 mg by mouth daily.    . Biotin (BIOTIN MAXIMUM STRENGTH) 10 MG TABS Take by mouth daily.    . celecoxib (CELEBREX) 100 MG capsule Take 1 capsule (100 mg total) by mouth 2 (two) times daily. 60 capsule 3  . Cholecalciferol (VITAMIN D-3 PO) Take by mouth daily.    Marland Kitchen HYDROcodone-acetaminophen (NORCO/VICODIN) 5-325 MG tablet Take 1 tablet by mouth every 6 (six) hours as needed for moderate pain. 60 tablet 0  . Magnesium 400 MG CAPS Take by mouth daily.    Marland Kitchen METFORMIN HCL ER PO Take by mouth. Take 250mg  daily.    . Omega-3 Fatty Acids (FISH OIL) 1000 MG CAPS Take by mouth daily.    Marland Kitchen oxyCODONE-acetaminophen (PERCOCET) 5-325 MG tablet Take 1 tablet by mouth every 8 (eight) hours as needed for severe pain. 90 tablet 0  . pantoprazole (PROTONIX) 40 MG tablet Take by mouth daily.    . pravastatin (PRAVACHOL) 20 MG tablet Take 20 mg by mouth daily.    . predniSONE (DELTASONE) 10 MG tablet One po qam x2 week, then 1/2 tab po qam 60 tablet 0  . TURMERIC PO Take by mouth daily.    Marland Kitchen VALSARTAN-HYDROCHLOROTHIAZIDE PO Take by mouth daily.     No current facility-administered medications for this visit.    PAST MEDICAL HISTORY: Past Medical History  Diagnosis Date  . Hypertension   . HX: breast cancer   . Hx of skin cancer, basal cell   . Barrett esophagus   . IBS (irritable bowel syndrome)   . Osteoarthritis   . Osteoporosis   .  Neck pain   . Hyperlipemia   . Prediabetes     PAST SURGICAL HISTORY: Past Surgical History  Procedure Laterality Date  . Nephrectomy Right     1955  . Mastectomy Left     2001  . Cholecystectomy      1975  . Cataract extraction Bilateral   . Basal cell carcinoma with focal sclerosis resection      Left shin  . Appendectomy    . Abdominal hysterectomy      FAMILY HISTORY: Family History  Problem Relation Age of Onset  . Congestive Heart Failure Mother   . Pneumonia Father   . Dementia Father     SOCIAL HISTORY:  Social History   Social History  . Marital Status: Married    Spouse Name: N/A  . Number of Children: 2  . Years of Education: 12   Occupational History  . Retired    Social History Main Topics  . Smoking status: Former Smoker  Types: Cigarettes  . Smokeless tobacco: Not on file     Comment: Quit 1970  . Alcohol Use: 0.0 oz/week    0 Standard drinks or equivalent per week     Comment: Occasional glass of wine  . Drug Use: No  . Sexual Activity: Not on file   Other Topics Concern  . Not on file   Social History Narrative   Lives at home with husband.   Right-handed.   2-3 cups caffeine per day.     PHYSICAL EXAM   Filed Vitals:   12/05/15 0825  BP: 152/62  Pulse: 64  Height: 5\' 3"  (1.6 m)  Weight: 135 lb (61.236 kg)    Not recorded      Body mass index is 23.92 kg/(m^2).  PHYSICAL EXAMNIATION:  Gen: NAD, conversant, well nourised, obese, well groomed                     Cardiovascular: Regular rate rhythm, no peripheral edema, warm, nontender. Eyes: Conjunctivae clear without exudates or hemorrhage Neck: Supple, no carotid bruise. Pulmonary: Clear to auscultation bilaterally   NEUROLOGICAL EXAM:  MENTAL STATUS: Speech:    Speech is normal; fluent and spontaneous with normal comprehension.  Cognition:     Orientation to time, place and person     Normal recent and remote memory     Normal Attention span and  concentration     Normal Language, naming, repeating,spontaneous speech     Fund of knowledge   CRANIAL NERVES:  CN II: Visual fields are full to confrontation. Fundoscopic exam is normal with sharp discs and no vascular changes. Pupils are round equal and briskly reactive to light. CN III, IV, VI: extraocular movement are normal. No ptosis. CN V: Facial sensation is intact to pinprick in all 3 divisions bilaterally. Corneal responses are intact.  CN VII: Face is symmetric with normal eye closure and smile. CN VIII: Hearing is normal to rubbing fingers CN IX, X: Palate elevates symmetrically. Phonation is normal. CN XI: Shoulder shrug are intact, she has limited range of motion of her neck turning, worsening pain turning to her left side, tends to hold her neck in the left tilted position.  tenderness of left upper cervical region, along left nuchal line CN XII: Tongue is midline with normal movements and no atrophy.  MOTOR: There is no pronator drift of out-stretched arms. Muscle bulk and tone are normal. Muscle strength is normal.  REFLEXES: Reflexes are 2+ and symmetric at the biceps, triceps, knees, and ankles. Plantar responses are flexor.  SENSORY: Intact to light touch, pinprick, position sense, and vibration sense are intact in fingers and toes.  COORDINATION: Rapid alternating movements and fine finger movements are intact. There is no dysmetria on finger-to-nose and heel-knee-shin.    GAIT/STANCE: Posture is normal. Gait is steady with normal steps, base, arm swing, and turning. Heel and toe walking are normal. Tandem gait is normal.  Romberg is absent.   DIAGNOSTIC DATA (LABS, IMAGING, TESTING) - I reviewed patient records, labs, notes, testing and imaging myself where available.   ASSESSMENT AND PLAN  [name] is a 82 y.o. female with neck pain since January 2016, limited range of motion, normal neurological examination  Cervical pain  evidence of left C1,  C2 lateral mass abnormality, likely inflammatory facet disease   She had left cervical fluoroscopy guided epidural injection with mild improvement, most recent injection December 03 2015  Ibuprofen as needed , continue hot compression  Repeat MRI of cervical spine without contrast, she had a history of nephrectomy in the past,  Marcial Pacas, M.D. Ph.D.  [facility] [address] [place], [place] [address] Ph: [phone] Fax: [phone]  CC: Dr. Jani Gravel

## 2015-12-14 ENCOUNTER — Ambulatory Visit
Admission: RE | Admit: 2015-12-14 | Discharge: 2015-12-14 | Disposition: A | Discharge status: Home / Self Care | Payer: Medicare Other | Source: Ambulatory Visit | Attending: Neurology | Admitting: Neurology

## 2015-12-14 DIAGNOSIS — M542 Cervicalgia: Secondary | ICD-10-CM

## 2015-12-17 NOTE — Telephone Encounter (Signed)
Patient aware of results and will keep her appt to further discuss.

## 2015-12-17 NOTE — Telephone Encounter (Signed)
Please call patient, severe C1-C2 degenerative changes, left more than right, appears stable compared to previous scan in September 2016, it is okay for her to proceed with epidural injection for pain control  IMPRESSION: 1. Severe C1-2 degenerative changes, left greater than right. This appears stable since the prior MRI and is unlikely infection or tumor. 2. Stable advanced degenerative cervical spondylosis with multilevel disc disease and facet disease. Stable multilevel spinal and foraminal stenosis as discussed above at the individual levels.

## 2016-01-07 DIAGNOSIS — M47812 Spondylosis without myelopathy or radiculopathy, cervical region: Secondary | ICD-10-CM | POA: Diagnosis not present

## 2016-01-07 DIAGNOSIS — M542 Cervicalgia: Secondary | ICD-10-CM | POA: Diagnosis not present

## 2016-01-07 DIAGNOSIS — G894 Chronic pain syndrome: Secondary | ICD-10-CM | POA: Diagnosis not present

## 2016-01-09 DIAGNOSIS — G894 Chronic pain syndrome: Secondary | ICD-10-CM | POA: Diagnosis not present

## 2016-01-09 DIAGNOSIS — M47812 Spondylosis without myelopathy or radiculopathy, cervical region: Secondary | ICD-10-CM | POA: Diagnosis not present

## 2016-01-09 DIAGNOSIS — M542 Cervicalgia: Secondary | ICD-10-CM | POA: Diagnosis not present

## 2016-01-16 DIAGNOSIS — R739 Hyperglycemia, unspecified: Secondary | ICD-10-CM | POA: Diagnosis not present

## 2016-01-16 DIAGNOSIS — I10 Essential (primary) hypertension: Secondary | ICD-10-CM | POA: Diagnosis not present

## 2016-01-21 DIAGNOSIS — I10 Essential (primary) hypertension: Secondary | ICD-10-CM | POA: Diagnosis not present

## 2016-01-21 DIAGNOSIS — E7800 Pure hypercholesterolemia, unspecified: Secondary | ICD-10-CM | POA: Diagnosis not present

## 2016-01-21 DIAGNOSIS — Z0000 Encounter for general adult medical examination without abnormal findings: Secondary | ICD-10-CM | POA: Diagnosis not present

## 2016-01-21 DIAGNOSIS — R7303 Prediabetes: Secondary | ICD-10-CM | POA: Diagnosis not present

## 2016-01-30 DIAGNOSIS — L570 Actinic keratosis: Secondary | ICD-10-CM | POA: Diagnosis not present

## 2016-01-30 DIAGNOSIS — D485 Neoplasm of uncertain behavior of skin: Secondary | ICD-10-CM | POA: Diagnosis not present

## 2016-01-30 DIAGNOSIS — L821 Other seborrheic keratosis: Secondary | ICD-10-CM | POA: Diagnosis not present

## 2016-01-30 DIAGNOSIS — Z23 Encounter for immunization: Secondary | ICD-10-CM | POA: Diagnosis not present

## 2016-01-30 DIAGNOSIS — Z85828 Personal history of other malignant neoplasm of skin: Secondary | ICD-10-CM | POA: Diagnosis not present

## 2016-02-04 ENCOUNTER — Ambulatory Visit: Payer: Medicare Other | Admitting: Neurology

## 2016-02-05 DIAGNOSIS — M5481 Occipital neuralgia: Secondary | ICD-10-CM | POA: Diagnosis not present

## 2016-02-05 DIAGNOSIS — M47812 Spondylosis without myelopathy or radiculopathy, cervical region: Secondary | ICD-10-CM | POA: Diagnosis not present

## 2016-02-05 DIAGNOSIS — M542 Cervicalgia: Secondary | ICD-10-CM | POA: Diagnosis not present

## 2016-02-05 DIAGNOSIS — G894 Chronic pain syndrome: Secondary | ICD-10-CM | POA: Diagnosis not present

## 2016-03-05 DIAGNOSIS — Z1231 Encounter for screening mammogram for malignant neoplasm of breast: Secondary | ICD-10-CM | POA: Diagnosis not present

## 2016-03-05 DIAGNOSIS — Z853 Personal history of malignant neoplasm of breast: Secondary | ICD-10-CM | POA: Diagnosis not present

## 2016-03-14 DIAGNOSIS — M47812 Spondylosis without myelopathy or radiculopathy, cervical region: Secondary | ICD-10-CM | POA: Diagnosis not present

## 2016-03-14 DIAGNOSIS — M542 Cervicalgia: Secondary | ICD-10-CM | POA: Diagnosis not present

## 2016-03-14 DIAGNOSIS — M5481 Occipital neuralgia: Secondary | ICD-10-CM | POA: Diagnosis not present

## 2016-03-14 DIAGNOSIS — G894 Chronic pain syndrome: Secondary | ICD-10-CM | POA: Diagnosis not present

## 2016-04-07 DIAGNOSIS — M47812 Spondylosis without myelopathy or radiculopathy, cervical region: Secondary | ICD-10-CM | POA: Diagnosis not present

## 2016-04-07 DIAGNOSIS — M5481 Occipital neuralgia: Secondary | ICD-10-CM | POA: Diagnosis not present

## 2016-04-07 DIAGNOSIS — M542 Cervicalgia: Secondary | ICD-10-CM | POA: Diagnosis not present

## 2016-04-07 DIAGNOSIS — G894 Chronic pain syndrome: Secondary | ICD-10-CM | POA: Diagnosis not present

## 2016-04-07 DIAGNOSIS — M4802 Spinal stenosis, cervical region: Secondary | ICD-10-CM | POA: Diagnosis not present

## 2016-04-08 DIAGNOSIS — M5030 Other cervical disc degeneration, unspecified cervical region: Secondary | ICD-10-CM | POA: Diagnosis not present

## 2016-04-08 DIAGNOSIS — M47812 Spondylosis without myelopathy or radiculopathy, cervical region: Secondary | ICD-10-CM | POA: Diagnosis not present

## 2016-04-08 DIAGNOSIS — M542 Cervicalgia: Secondary | ICD-10-CM | POA: Diagnosis not present

## 2016-04-08 DIAGNOSIS — M4802 Spinal stenosis, cervical region: Secondary | ICD-10-CM | POA: Diagnosis not present

## 2016-04-08 DIAGNOSIS — M5481 Occipital neuralgia: Secondary | ICD-10-CM | POA: Diagnosis not present

## 2016-04-08 DIAGNOSIS — G894 Chronic pain syndrome: Secondary | ICD-10-CM | POA: Diagnosis not present

## 2016-04-23 DIAGNOSIS — M5030 Other cervical disc degeneration, unspecified cervical region: Secondary | ICD-10-CM | POA: Diagnosis not present

## 2016-04-23 DIAGNOSIS — M5481 Occipital neuralgia: Secondary | ICD-10-CM | POA: Diagnosis not present

## 2016-04-23 DIAGNOSIS — M4802 Spinal stenosis, cervical region: Secondary | ICD-10-CM | POA: Diagnosis not present

## 2016-04-23 DIAGNOSIS — G894 Chronic pain syndrome: Secondary | ICD-10-CM | POA: Diagnosis not present

## 2016-04-23 DIAGNOSIS — M47812 Spondylosis without myelopathy or radiculopathy, cervical region: Secondary | ICD-10-CM | POA: Diagnosis not present

## 2016-04-23 DIAGNOSIS — M542 Cervicalgia: Secondary | ICD-10-CM | POA: Diagnosis not present

## 2016-04-24 DIAGNOSIS — H43393 Other vitreous opacities, bilateral: Secondary | ICD-10-CM | POA: Diagnosis not present

## 2016-04-24 DIAGNOSIS — Z961 Presence of intraocular lens: Secondary | ICD-10-CM | POA: Diagnosis not present

## 2016-04-24 DIAGNOSIS — H26491 Other secondary cataract, right eye: Secondary | ICD-10-CM | POA: Diagnosis not present

## 2016-04-24 DIAGNOSIS — H04123 Dry eye syndrome of bilateral lacrimal glands: Secondary | ICD-10-CM | POA: Diagnosis not present

## 2016-04-24 DIAGNOSIS — E119 Type 2 diabetes mellitus without complications: Secondary | ICD-10-CM | POA: Diagnosis not present

## 2016-05-01 DIAGNOSIS — H26491 Other secondary cataract, right eye: Secondary | ICD-10-CM | POA: Diagnosis not present

## 2016-05-01 DIAGNOSIS — Z961 Presence of intraocular lens: Secondary | ICD-10-CM | POA: Diagnosis not present

## 2016-07-16 DIAGNOSIS — R7303 Prediabetes: Secondary | ICD-10-CM | POA: Diagnosis not present

## 2016-07-16 DIAGNOSIS — I10 Essential (primary) hypertension: Secondary | ICD-10-CM | POA: Diagnosis not present

## 2016-07-21 DIAGNOSIS — E7800 Pure hypercholesterolemia, unspecified: Secondary | ICD-10-CM | POA: Diagnosis not present

## 2016-07-21 DIAGNOSIS — F419 Anxiety disorder, unspecified: Secondary | ICD-10-CM | POA: Diagnosis not present

## 2016-07-21 DIAGNOSIS — I10 Essential (primary) hypertension: Secondary | ICD-10-CM | POA: Diagnosis not present

## 2016-08-08 DIAGNOSIS — Z23 Encounter for immunization: Secondary | ICD-10-CM | POA: Diagnosis not present

## 2016-08-18 DIAGNOSIS — I10 Essential (primary) hypertension: Secondary | ICD-10-CM | POA: Diagnosis not present

## 2016-09-29 DIAGNOSIS — R300 Dysuria: Secondary | ICD-10-CM | POA: Diagnosis not present

## 2017-01-14 DIAGNOSIS — F419 Anxiety disorder, unspecified: Secondary | ICD-10-CM | POA: Diagnosis not present

## 2017-01-14 DIAGNOSIS — I10 Essential (primary) hypertension: Secondary | ICD-10-CM | POA: Diagnosis not present

## 2017-01-21 DIAGNOSIS — R079 Chest pain, unspecified: Secondary | ICD-10-CM | POA: Diagnosis not present

## 2017-01-21 DIAGNOSIS — Z5181 Encounter for therapeutic drug level monitoring: Secondary | ICD-10-CM | POA: Diagnosis not present

## 2017-01-21 DIAGNOSIS — Z23 Encounter for immunization: Secondary | ICD-10-CM | POA: Diagnosis not present

## 2017-01-21 DIAGNOSIS — F419 Anxiety disorder, unspecified: Secondary | ICD-10-CM | POA: Diagnosis not present

## 2017-01-30 DIAGNOSIS — J301 Allergic rhinitis due to pollen: Secondary | ICD-10-CM | POA: Diagnosis not present

## 2017-01-30 DIAGNOSIS — I10 Essential (primary) hypertension: Secondary | ICD-10-CM | POA: Diagnosis not present

## 2017-01-30 DIAGNOSIS — Z0000 Encounter for general adult medical examination without abnormal findings: Secondary | ICD-10-CM | POA: Diagnosis not present

## 2017-01-30 DIAGNOSIS — E7800 Pure hypercholesterolemia, unspecified: Secondary | ICD-10-CM | POA: Diagnosis not present

## 2017-01-30 DIAGNOSIS — R079 Chest pain, unspecified: Secondary | ICD-10-CM | POA: Diagnosis not present

## 2017-02-02 DIAGNOSIS — D1801 Hemangioma of skin and subcutaneous tissue: Secondary | ICD-10-CM | POA: Diagnosis not present

## 2017-02-02 DIAGNOSIS — L814 Other melanin hyperpigmentation: Secondary | ICD-10-CM | POA: Diagnosis not present

## 2017-02-02 DIAGNOSIS — L570 Actinic keratosis: Secondary | ICD-10-CM | POA: Diagnosis not present

## 2017-02-02 DIAGNOSIS — L821 Other seborrheic keratosis: Secondary | ICD-10-CM | POA: Diagnosis not present

## 2017-02-05 DIAGNOSIS — K210 Gastro-esophageal reflux disease with esophagitis: Secondary | ICD-10-CM | POA: Diagnosis not present

## 2017-02-05 DIAGNOSIS — I10 Essential (primary) hypertension: Secondary | ICD-10-CM | POA: Diagnosis not present

## 2017-02-05 DIAGNOSIS — E7800 Pure hypercholesterolemia, unspecified: Secondary | ICD-10-CM | POA: Diagnosis not present

## 2017-02-05 DIAGNOSIS — R079 Chest pain, unspecified: Secondary | ICD-10-CM | POA: Diagnosis not present

## 2017-02-10 DIAGNOSIS — R079 Chest pain, unspecified: Secondary | ICD-10-CM | POA: Diagnosis not present

## 2017-02-10 DIAGNOSIS — I10 Essential (primary) hypertension: Secondary | ICD-10-CM | POA: Diagnosis not present

## 2017-03-06 DIAGNOSIS — Z1231 Encounter for screening mammogram for malignant neoplasm of breast: Secondary | ICD-10-CM | POA: Diagnosis not present

## 2017-03-06 DIAGNOSIS — Z853 Personal history of malignant neoplasm of breast: Secondary | ICD-10-CM | POA: Diagnosis not present

## 2017-03-09 DIAGNOSIS — I10 Essential (primary) hypertension: Secondary | ICD-10-CM | POA: Diagnosis not present

## 2017-03-09 DIAGNOSIS — R0789 Other chest pain: Secondary | ICD-10-CM | POA: Diagnosis not present

## 2017-03-23 DIAGNOSIS — E7800 Pure hypercholesterolemia, unspecified: Secondary | ICD-10-CM | POA: Diagnosis not present

## 2017-03-23 DIAGNOSIS — R300 Dysuria: Secondary | ICD-10-CM | POA: Diagnosis not present

## 2017-03-23 DIAGNOSIS — R079 Chest pain, unspecified: Secondary | ICD-10-CM | POA: Diagnosis not present

## 2017-03-23 DIAGNOSIS — K210 Gastro-esophageal reflux disease with esophagitis: Secondary | ICD-10-CM | POA: Diagnosis not present

## 2017-03-23 DIAGNOSIS — N390 Urinary tract infection, site not specified: Secondary | ICD-10-CM | POA: Diagnosis not present

## 2017-03-23 DIAGNOSIS — I10 Essential (primary) hypertension: Secondary | ICD-10-CM | POA: Diagnosis not present

## 2017-05-07 DIAGNOSIS — E119 Type 2 diabetes mellitus without complications: Secondary | ICD-10-CM | POA: Diagnosis not present

## 2017-05-07 DIAGNOSIS — H01021 Squamous blepharitis right upper eyelid: Secondary | ICD-10-CM | POA: Diagnosis not present

## 2017-05-07 DIAGNOSIS — H01022 Squamous blepharitis right lower eyelid: Secondary | ICD-10-CM | POA: Diagnosis not present

## 2017-05-07 DIAGNOSIS — H353131 Nonexudative age-related macular degeneration, bilateral, early dry stage: Secondary | ICD-10-CM | POA: Diagnosis not present

## 2017-07-30 DIAGNOSIS — J301 Allergic rhinitis due to pollen: Secondary | ICD-10-CM | POA: Diagnosis not present

## 2017-07-30 DIAGNOSIS — Z780 Asymptomatic menopausal state: Secondary | ICD-10-CM | POA: Diagnosis not present

## 2017-07-30 DIAGNOSIS — I10 Essential (primary) hypertension: Secondary | ICD-10-CM | POA: Diagnosis not present

## 2017-08-04 DIAGNOSIS — E7800 Pure hypercholesterolemia, unspecified: Secondary | ICD-10-CM | POA: Diagnosis not present

## 2017-08-04 DIAGNOSIS — I10 Essential (primary) hypertension: Secondary | ICD-10-CM | POA: Diagnosis not present

## 2017-08-04 DIAGNOSIS — Z23 Encounter for immunization: Secondary | ICD-10-CM | POA: Diagnosis not present

## 2017-08-04 DIAGNOSIS — R739 Hyperglycemia, unspecified: Secondary | ICD-10-CM | POA: Diagnosis not present

## 2017-09-18 IMAGING — MR MR CERVICAL SPINE W/O CM
4 of 5 series · 27 of 48 positions shown · non-contrast
Comparison: MRI 08/21/2015

CLINICAL DATA: Persistent left-sided neck pain. Followup abnormal
cervical spine MRI from 08/21/2015.

EXAM:
MRI CERVICAL SPINE WITHOUT CONTRAST
TECHNIQUE: Multiplanar, multisequence MR imaging of the cervical spine was
performed. No intravenous contrast was administered.

[Series 6: T1 · sagittal · 3.0mm · 0.66mm/px · 6 of 15 slices shown]
[im 1/15]
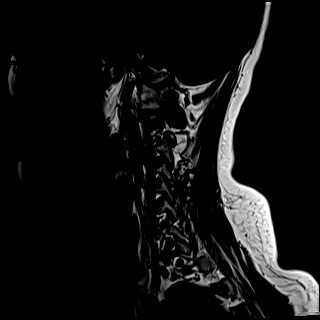
[im 3/15]
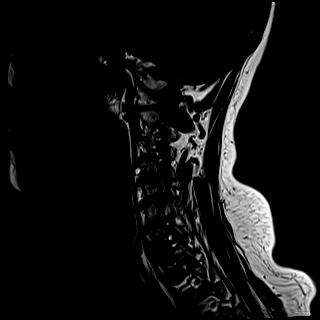
[im 6/15]
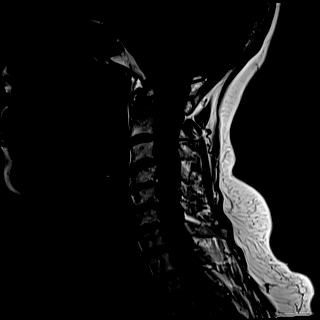
[im 9/15]
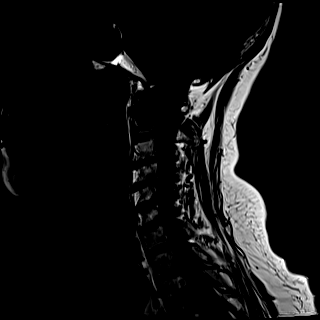
[im 12/15]
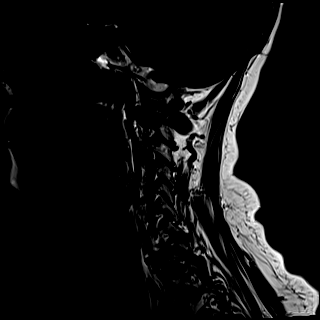
[im 15/15]
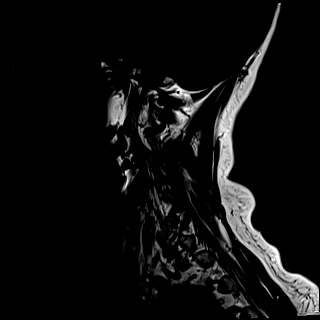

[Series 7: T2 · sagittal · 3.0mm · 0.55mm/px · 7 of 15 slices shown (1 of 2)]
[im 1/15]
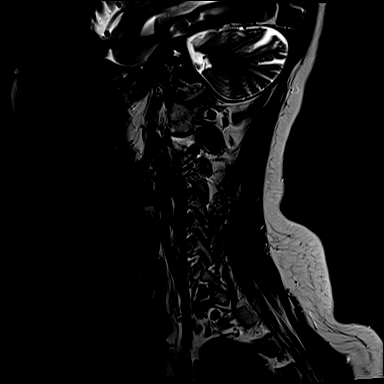
[im 3/15]
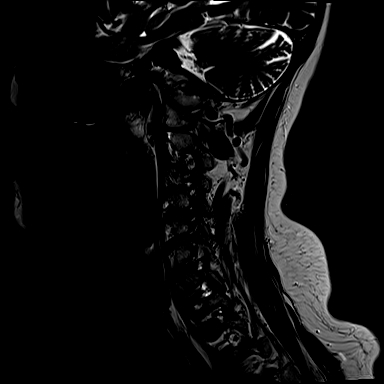
[im 5/15]
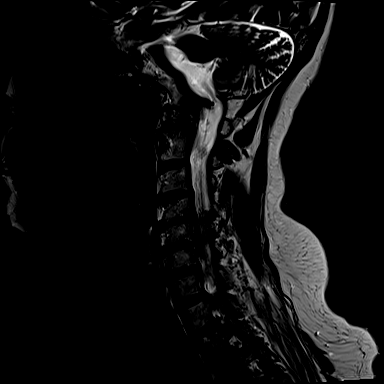
[im 8/15]
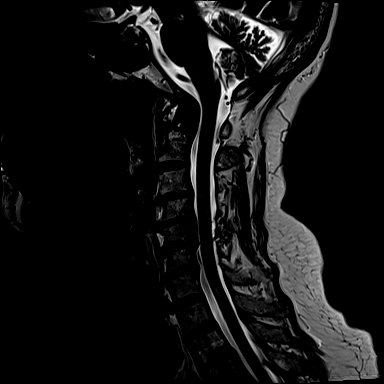
[im 10/15]
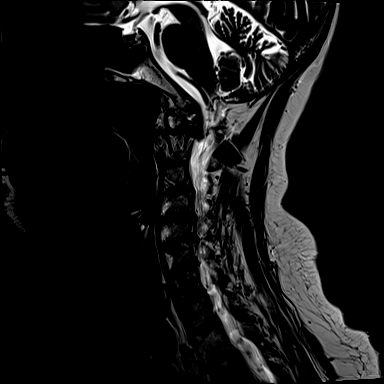
[im 12/15]
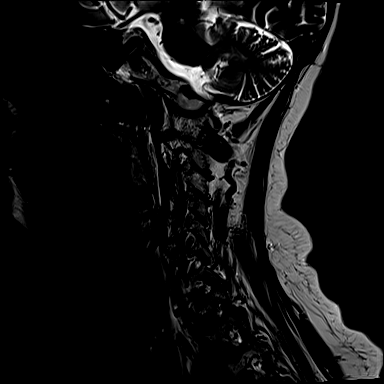
[im 15/15]
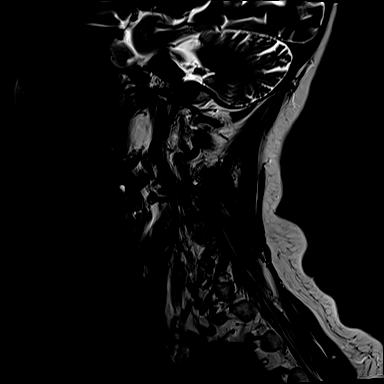

[Series 8: STIR · sagittal · 3.0mm · 0.33mm/px · 6 of 15 slices shown]
[im 1/15]
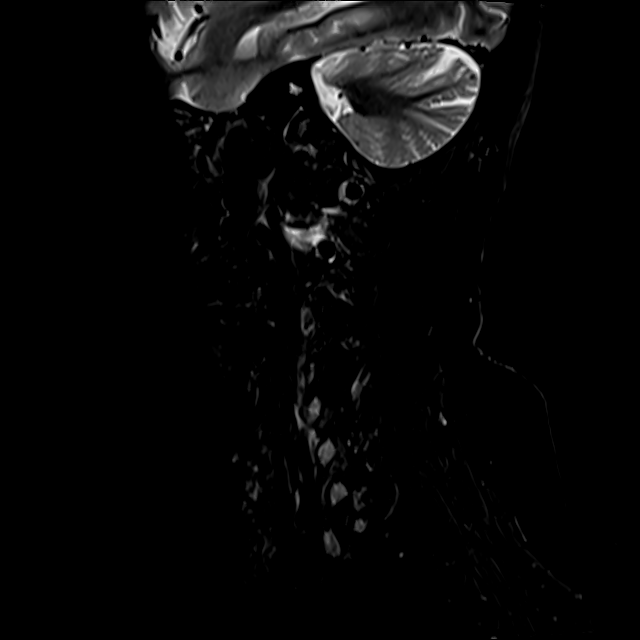
[im 3/15]
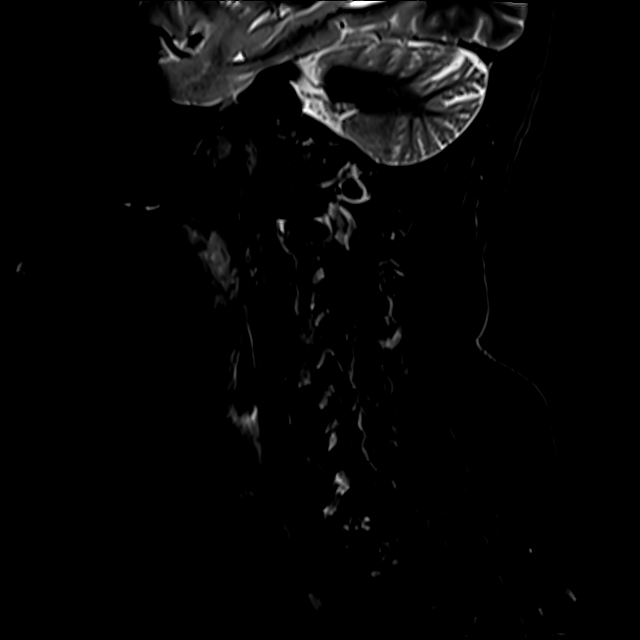
[im 5/15]
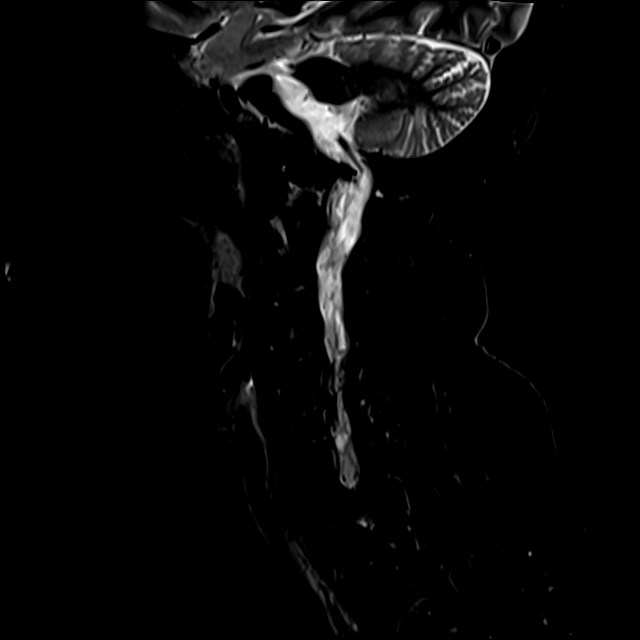
[im 8/15]
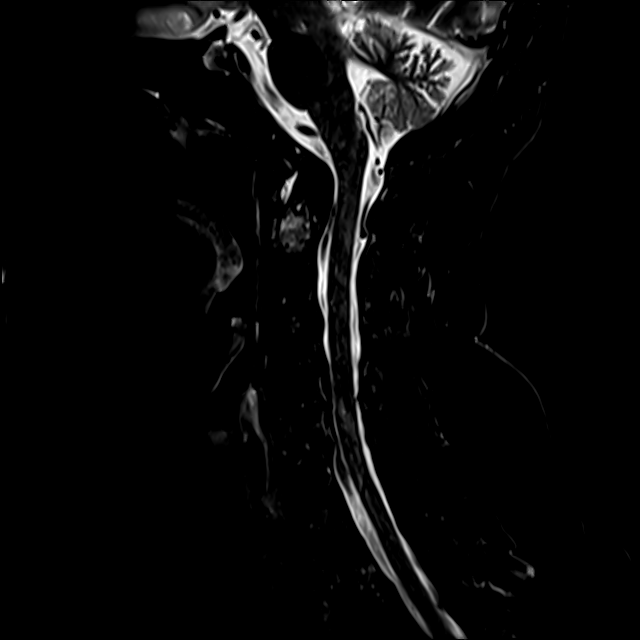
[im 10/15]
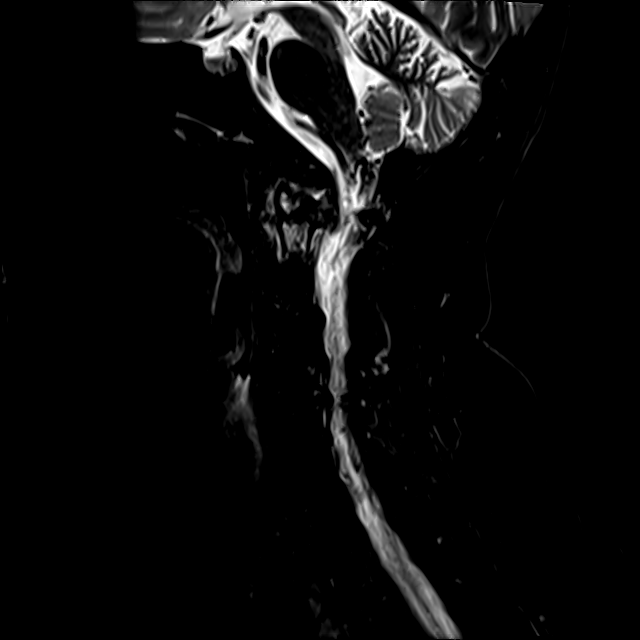
[im 12/15]
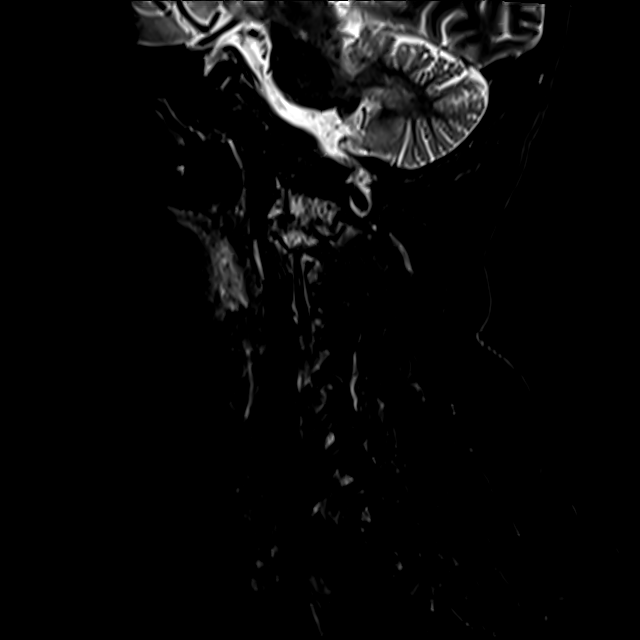

[Series 9: T2 · axial · 3.0mm · 0.50mm/px · z∈[-36,+62]mm · 8 of 32 slices shown (2 of 2)]
[im 1/32]
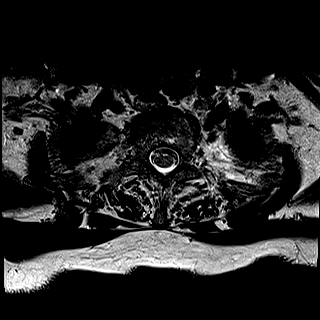
[im 5/32]
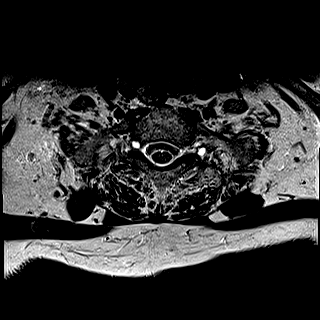
[im 10/32]
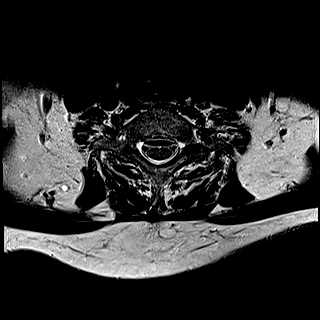
[im 15/32]
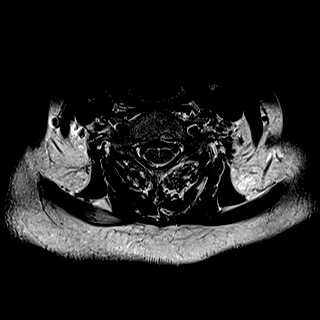
[im 17/32]
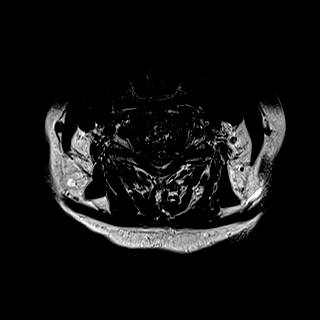
[im 22/32]
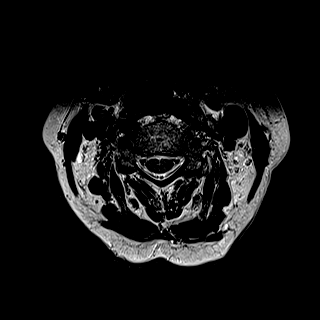
[im 27/32]
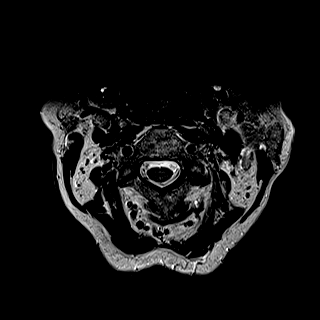
[im 32/32]
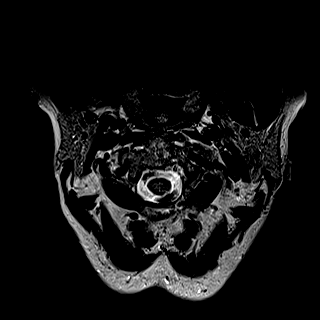

[27 of 48 positions shown; findings below may reference images not displayed]

FINDINGS: Stable alignment of the cervical vertebral bodies. Stable advanced
degenerative cervical spondylosis with multilevel disc disease and
facet disease. Advanced degenerative changes are noted at C1-2 on
the left side with moderate edema like signal abnormality in the
dens and lateral mass of C1. This could be due to severe
arthropathy. Given the relative stability over the past 4 months is
on likely to be infection or neoplasm. CT may be helpful for further
evaluation. No significant mass effect on the upper cervical cord.
No cord lesions or syrinx.

C2-3:  No significant findings.

C3-4: Diffuse annular bulge and osteophytic ridging along with
uncinate spurring. There is flattening of the ventral thecal sac and
mild narrowing of the ventral CSF space. Mild foraminal stenosis
bilaterally, left greater than right.

C4-5: Bulging degenerated annulus, osteophytic ridging and uncinate
spurring. There is flattening of the ventral thecal sac and
narrowing of the ventral CSF space. Mild to moderate foraminal
stenosis bilaterally.

C5-6: Broad-based bulging degenerated annulus, osteophytic ridging
and uncinate spurring with mass effect on the ventral thecal sac and
narrowing of the ventral CSF space. Mild bilateral foraminal
stenosis.

C6-7: Minimal disc bulge. No significant spinal or foraminal
stenosis.

C7-T1:  No significant findings.
IMPRESSION: 1. Severe C1-2 degenerative changes, left greater than right. This
appears stable since the prior MRI and is unlikely infection or
tumor.
2. Stable advanced degenerative cervical spondylosis with multilevel
disc disease and facet disease. Stable multilevel spinal and
foraminal stenosis as discussed above at the individual levels.

## 2017-10-30 DIAGNOSIS — M109 Gout, unspecified: Secondary | ICD-10-CM | POA: Diagnosis not present

## 2017-10-30 DIAGNOSIS — E7800 Pure hypercholesterolemia, unspecified: Secondary | ICD-10-CM | POA: Diagnosis not present

## 2017-10-30 DIAGNOSIS — I10 Essential (primary) hypertension: Secondary | ICD-10-CM | POA: Diagnosis not present

## 2017-10-30 DIAGNOSIS — F419 Anxiety disorder, unspecified: Secondary | ICD-10-CM | POA: Diagnosis not present

## 2017-11-06 DIAGNOSIS — M109 Gout, unspecified: Secondary | ICD-10-CM | POA: Diagnosis not present

## 2017-11-06 DIAGNOSIS — I10 Essential (primary) hypertension: Secondary | ICD-10-CM | POA: Diagnosis not present

## 2017-12-29 DIAGNOSIS — R2681 Unsteadiness on feet: Secondary | ICD-10-CM | POA: Diagnosis not present

## 2017-12-29 DIAGNOSIS — J0100 Acute maxillary sinusitis, unspecified: Secondary | ICD-10-CM | POA: Diagnosis not present

## 2017-12-29 DIAGNOSIS — I10 Essential (primary) hypertension: Secondary | ICD-10-CM | POA: Diagnosis not present

## 2018-01-04 DIAGNOSIS — K219 Gastro-esophageal reflux disease without esophagitis: Secondary | ICD-10-CM | POA: Diagnosis not present

## 2018-01-04 DIAGNOSIS — Z792 Long term (current) use of antibiotics: Secondary | ICD-10-CM | POA: Diagnosis not present

## 2018-01-04 DIAGNOSIS — I10 Essential (primary) hypertension: Secondary | ICD-10-CM | POA: Diagnosis not present

## 2018-01-04 DIAGNOSIS — M25562 Pain in left knee: Secondary | ICD-10-CM | POA: Diagnosis not present

## 2018-01-04 DIAGNOSIS — J0100 Acute maxillary sinusitis, unspecified: Secondary | ICD-10-CM | POA: Diagnosis not present

## 2018-01-04 DIAGNOSIS — Z9181 History of falling: Secondary | ICD-10-CM | POA: Diagnosis not present

## 2018-01-14 DIAGNOSIS — C50912 Malignant neoplasm of unspecified site of left female breast: Secondary | ICD-10-CM | POA: Diagnosis not present

## 2018-01-28 DIAGNOSIS — R739 Hyperglycemia, unspecified: Secondary | ICD-10-CM | POA: Diagnosis not present

## 2018-01-28 DIAGNOSIS — D649 Anemia, unspecified: Secondary | ICD-10-CM | POA: Diagnosis not present

## 2018-01-28 DIAGNOSIS — I10 Essential (primary) hypertension: Secondary | ICD-10-CM | POA: Diagnosis not present

## 2018-01-28 DIAGNOSIS — D638 Anemia in other chronic diseases classified elsewhere: Secondary | ICD-10-CM | POA: Diagnosis not present

## 2018-02-01 DIAGNOSIS — C50912 Malignant neoplasm of unspecified site of left female breast: Secondary | ICD-10-CM | POA: Diagnosis not present

## 2018-02-03 DIAGNOSIS — L814 Other melanin hyperpigmentation: Secondary | ICD-10-CM | POA: Diagnosis not present

## 2018-02-03 DIAGNOSIS — D1801 Hemangioma of skin and subcutaneous tissue: Secondary | ICD-10-CM | POA: Diagnosis not present

## 2018-02-03 DIAGNOSIS — L570 Actinic keratosis: Secondary | ICD-10-CM | POA: Diagnosis not present

## 2018-02-03 DIAGNOSIS — L821 Other seborrheic keratosis: Secondary | ICD-10-CM | POA: Diagnosis not present

## 2018-02-04 DIAGNOSIS — Z0000 Encounter for general adult medical examination without abnormal findings: Secondary | ICD-10-CM | POA: Diagnosis not present

## 2018-02-04 DIAGNOSIS — R739 Hyperglycemia, unspecified: Secondary | ICD-10-CM | POA: Diagnosis not present

## 2018-03-09 DIAGNOSIS — Z853 Personal history of malignant neoplasm of breast: Secondary | ICD-10-CM | POA: Diagnosis not present

## 2018-03-09 DIAGNOSIS — Z1231 Encounter for screening mammogram for malignant neoplasm of breast: Secondary | ICD-10-CM | POA: Diagnosis not present

## 2018-04-13 DIAGNOSIS — I10 Essential (primary) hypertension: Secondary | ICD-10-CM | POA: Diagnosis not present

## 2018-04-13 DIAGNOSIS — K219 Gastro-esophageal reflux disease without esophagitis: Secondary | ICD-10-CM | POA: Diagnosis not present

## 2018-04-13 DIAGNOSIS — J0100 Acute maxillary sinusitis, unspecified: Secondary | ICD-10-CM | POA: Diagnosis not present

## 2018-04-13 DIAGNOSIS — M25562 Pain in left knee: Secondary | ICD-10-CM | POA: Diagnosis not present

## 2018-04-28 DIAGNOSIS — S2242XA Multiple fractures of ribs, left side, initial encounter for closed fracture: Secondary | ICD-10-CM | POA: Diagnosis not present

## 2018-04-28 DIAGNOSIS — W19XXXA Unspecified fall, initial encounter: Secondary | ICD-10-CM | POA: Diagnosis not present

## 2018-04-28 DIAGNOSIS — E0781 Sick-euthyroid syndrome: Secondary | ICD-10-CM | POA: Diagnosis not present

## 2018-04-28 DIAGNOSIS — R0781 Pleurodynia: Secondary | ICD-10-CM | POA: Diagnosis not present

## 2018-04-28 DIAGNOSIS — H1131 Conjunctival hemorrhage, right eye: Secondary | ICD-10-CM | POA: Diagnosis not present

## 2018-06-08 DIAGNOSIS — H01022 Squamous blepharitis right lower eyelid: Secondary | ICD-10-CM | POA: Diagnosis not present

## 2018-06-08 DIAGNOSIS — H353131 Nonexudative age-related macular degeneration, bilateral, early dry stage: Secondary | ICD-10-CM | POA: Diagnosis not present

## 2018-06-08 DIAGNOSIS — E119 Type 2 diabetes mellitus without complications: Secondary | ICD-10-CM | POA: Diagnosis not present

## 2018-06-08 DIAGNOSIS — H01021 Squamous blepharitis right upper eyelid: Secondary | ICD-10-CM | POA: Diagnosis not present

## 2018-06-23 DIAGNOSIS — R109 Unspecified abdominal pain: Secondary | ICD-10-CM | POA: Diagnosis not present

## 2018-06-23 DIAGNOSIS — I10 Essential (primary) hypertension: Secondary | ICD-10-CM | POA: Diagnosis not present

## 2018-06-23 DIAGNOSIS — R509 Fever, unspecified: Secondary | ICD-10-CM | POA: Diagnosis not present

## 2018-07-21 DIAGNOSIS — N390 Urinary tract infection, site not specified: Secondary | ICD-10-CM | POA: Diagnosis not present

## 2018-07-29 DIAGNOSIS — E7800 Pure hypercholesterolemia, unspecified: Secondary | ICD-10-CM | POA: Diagnosis not present

## 2018-07-29 DIAGNOSIS — R739 Hyperglycemia, unspecified: Secondary | ICD-10-CM | POA: Diagnosis not present

## 2018-08-05 DIAGNOSIS — R7309 Other abnormal glucose: Secondary | ICD-10-CM | POA: Diagnosis not present

## 2018-08-05 DIAGNOSIS — Z23 Encounter for immunization: Secondary | ICD-10-CM | POA: Diagnosis not present

## 2018-08-05 DIAGNOSIS — E7800 Pure hypercholesterolemia, unspecified: Secondary | ICD-10-CM | POA: Diagnosis not present

## 2018-08-05 DIAGNOSIS — I10 Essential (primary) hypertension: Secondary | ICD-10-CM | POA: Diagnosis not present

## 2018-09-07 DIAGNOSIS — M549 Dorsalgia, unspecified: Secondary | ICD-10-CM | POA: Diagnosis not present

## 2018-09-07 DIAGNOSIS — M25551 Pain in right hip: Secondary | ICD-10-CM | POA: Diagnosis not present

## 2018-09-07 DIAGNOSIS — M1611 Unilateral primary osteoarthritis, right hip: Secondary | ICD-10-CM | POA: Diagnosis not present

## 2018-09-07 DIAGNOSIS — M546 Pain in thoracic spine: Secondary | ICD-10-CM | POA: Diagnosis not present

## 2018-09-07 DIAGNOSIS — I10 Essential (primary) hypertension: Secondary | ICD-10-CM | POA: Diagnosis not present

## 2018-09-07 DIAGNOSIS — M7989 Other specified soft tissue disorders: Secondary | ICD-10-CM | POA: Diagnosis not present

## 2018-09-21 DIAGNOSIS — I10 Essential (primary) hypertension: Secondary | ICD-10-CM | POA: Diagnosis not present

## 2018-09-21 DIAGNOSIS — M533 Sacrococcygeal disorders, not elsewhere classified: Secondary | ICD-10-CM | POA: Diagnosis not present

## 2018-09-21 DIAGNOSIS — M7989 Other specified soft tissue disorders: Secondary | ICD-10-CM | POA: Diagnosis not present

## 2018-09-21 DIAGNOSIS — S22000A Wedge compression fracture of unspecified thoracic vertebra, initial encounter for closed fracture: Secondary | ICD-10-CM | POA: Diagnosis not present

## 2018-09-23 DIAGNOSIS — M533 Sacrococcygeal disorders, not elsewhere classified: Secondary | ICD-10-CM | POA: Diagnosis not present

## 2018-09-23 DIAGNOSIS — M7989 Other specified soft tissue disorders: Secondary | ICD-10-CM | POA: Diagnosis not present

## 2018-09-27 ENCOUNTER — Ambulatory Visit
Admission: RE | Admit: 2018-09-27 | Discharge: 2018-09-27 | Disposition: A | Discharge status: Home / Self Care | Payer: Medicare Other | Source: Ambulatory Visit | Attending: Family Medicine | Admitting: Family Medicine

## 2018-09-27 ENCOUNTER — Other Ambulatory Visit: Payer: Self-pay | Admitting: Family Medicine

## 2018-09-27 DIAGNOSIS — I824Y9 Acute embolism and thrombosis of unspecified deep veins of unspecified proximal lower extremity: Secondary | ICD-10-CM

## 2018-09-27 DIAGNOSIS — M7989 Other specified soft tissue disorders: Secondary | ICD-10-CM

## 2018-09-27 DIAGNOSIS — R600 Localized edema: Secondary | ICD-10-CM | POA: Diagnosis not present

## 2018-09-29 DIAGNOSIS — R600 Localized edema: Secondary | ICD-10-CM | POA: Diagnosis not present

## 2018-09-29 DIAGNOSIS — Z79899 Other long term (current) drug therapy: Secondary | ICD-10-CM | POA: Diagnosis not present

## 2018-09-29 DIAGNOSIS — M533 Sacrococcygeal disorders, not elsewhere classified: Secondary | ICD-10-CM | POA: Diagnosis not present

## 2018-10-02 ENCOUNTER — Other Ambulatory Visit: Payer: Self-pay

## 2018-10-02 ENCOUNTER — Encounter (HOSPITAL_COMMUNITY): Payer: Self-pay | Admitting: Emergency Medicine

## 2018-10-02 ENCOUNTER — Emergency Department (HOSPITAL_COMMUNITY): Payer: Medicare Other

## 2018-10-02 ENCOUNTER — Inpatient Hospital Stay (HOSPITAL_COMMUNITY)
Admission: EM | Admit: 2018-10-02 | Discharge: 2018-10-09 | DRG: 628 | Disposition: A | Discharge status: Skilled Nursing Facility | Payer: Medicare Other | Attending: Internal Medicine | Admitting: Internal Medicine

## 2018-10-02 DIAGNOSIS — Z7982 Long term (current) use of aspirin: Secondary | ICD-10-CM

## 2018-10-02 DIAGNOSIS — C50919 Malignant neoplasm of unspecified site of unspecified female breast: Secondary | ICD-10-CM | POA: Diagnosis not present

## 2018-10-02 DIAGNOSIS — Z960 Presence of urogenital implants: Secondary | ICD-10-CM | POA: Diagnosis not present

## 2018-10-02 DIAGNOSIS — R112 Nausea with vomiting, unspecified: Secondary | ICD-10-CM | POA: Diagnosis not present

## 2018-10-02 DIAGNOSIS — I10 Essential (primary) hypertension: Secondary | ICD-10-CM | POA: Diagnosis present

## 2018-10-02 DIAGNOSIS — M899 Disorder of bone, unspecified: Secondary | ICD-10-CM | POA: Diagnosis not present

## 2018-10-02 DIAGNOSIS — Z9889 Other specified postprocedural states: Secondary | ICD-10-CM

## 2018-10-02 DIAGNOSIS — G8929 Other chronic pain: Secondary | ICD-10-CM | POA: Diagnosis present

## 2018-10-02 DIAGNOSIS — G9341 Metabolic encephalopathy: Secondary | ICD-10-CM | POA: Diagnosis not present

## 2018-10-02 DIAGNOSIS — Z85828 Personal history of other malignant neoplasm of skin: Secondary | ICD-10-CM

## 2018-10-02 DIAGNOSIS — N1339 Other hydronephrosis: Secondary | ICD-10-CM | POA: Diagnosis not present

## 2018-10-02 DIAGNOSIS — Z9012 Acquired absence of left breast and nipple: Secondary | ICD-10-CM

## 2018-10-02 DIAGNOSIS — R54 Age-related physical debility: Secondary | ICD-10-CM | POA: Diagnosis present

## 2018-10-02 DIAGNOSIS — Z7401 Bed confinement status: Secondary | ICD-10-CM | POA: Diagnosis not present

## 2018-10-02 DIAGNOSIS — M810 Age-related osteoporosis without current pathological fracture: Secondary | ICD-10-CM | POA: Diagnosis present

## 2018-10-02 DIAGNOSIS — I5031 Acute diastolic (congestive) heart failure: Secondary | ICD-10-CM | POA: Diagnosis present

## 2018-10-02 DIAGNOSIS — R945 Abnormal results of liver function studies: Secondary | ICD-10-CM | POA: Diagnosis not present

## 2018-10-02 DIAGNOSIS — K5903 Drug induced constipation: Secondary | ICD-10-CM | POA: Diagnosis present

## 2018-10-02 DIAGNOSIS — C9000 Multiple myeloma not having achieved remission: Secondary | ICD-10-CM | POA: Diagnosis not present

## 2018-10-02 DIAGNOSIS — D649 Anemia, unspecified: Secondary | ICD-10-CM | POA: Diagnosis not present

## 2018-10-02 DIAGNOSIS — X58XXXA Exposure to other specified factors, initial encounter: Secondary | ICD-10-CM | POA: Diagnosis present

## 2018-10-02 DIAGNOSIS — R846 Abnormal cytological findings in specimens from respiratory organs and thorax: Secondary | ICD-10-CM | POA: Diagnosis not present

## 2018-10-02 DIAGNOSIS — S31821A Laceration without foreign body of left buttock, initial encounter: Secondary | ICD-10-CM | POA: Diagnosis not present

## 2018-10-02 DIAGNOSIS — R0902 Hypoxemia: Secondary | ICD-10-CM | POA: Diagnosis not present

## 2018-10-02 DIAGNOSIS — N1330 Unspecified hydronephrosis: Secondary | ICD-10-CM | POA: Diagnosis not present

## 2018-10-02 DIAGNOSIS — J90 Pleural effusion, not elsewhere classified: Secondary | ICD-10-CM | POA: Diagnosis not present

## 2018-10-02 DIAGNOSIS — Z66 Do not resuscitate: Secondary | ICD-10-CM | POA: Diagnosis present

## 2018-10-02 DIAGNOSIS — R7989 Other specified abnormal findings of blood chemistry: Secondary | ICD-10-CM | POA: Diagnosis present

## 2018-10-02 DIAGNOSIS — R599 Enlarged lymph nodes, unspecified: Secondary | ICD-10-CM | POA: Diagnosis not present

## 2018-10-02 DIAGNOSIS — J810 Acute pulmonary edema: Secondary | ICD-10-CM | POA: Diagnosis not present

## 2018-10-02 DIAGNOSIS — D72829 Elevated white blood cell count, unspecified: Secondary | ICD-10-CM | POA: Diagnosis not present

## 2018-10-02 DIAGNOSIS — N3289 Other specified disorders of bladder: Secondary | ICD-10-CM | POA: Diagnosis present

## 2018-10-02 DIAGNOSIS — T40605A Adverse effect of unspecified narcotics, initial encounter: Secondary | ICD-10-CM | POA: Diagnosis not present

## 2018-10-02 DIAGNOSIS — M545 Low back pain: Secondary | ICD-10-CM | POA: Diagnosis present

## 2018-10-02 DIAGNOSIS — E042 Nontoxic multinodular goiter: Secondary | ICD-10-CM | POA: Diagnosis not present

## 2018-10-02 DIAGNOSIS — M2550 Pain in unspecified joint: Secondary | ICD-10-CM | POA: Diagnosis not present

## 2018-10-02 DIAGNOSIS — R05 Cough: Secondary | ICD-10-CM

## 2018-10-02 DIAGNOSIS — M533 Sacrococcygeal disorders, not elsewhere classified: Secondary | ICD-10-CM | POA: Diagnosis present

## 2018-10-02 DIAGNOSIS — K2270 Barrett's esophagus without dysplasia: Secondary | ICD-10-CM | POA: Diagnosis present

## 2018-10-02 DIAGNOSIS — Z90710 Acquired absence of both cervix and uterus: Secondary | ICD-10-CM

## 2018-10-02 DIAGNOSIS — Z8249 Family history of ischemic heart disease and other diseases of the circulatory system: Secondary | ICD-10-CM

## 2018-10-02 DIAGNOSIS — R590 Localized enlarged lymph nodes: Secondary | ICD-10-CM | POA: Diagnosis not present

## 2018-10-02 DIAGNOSIS — Z853 Personal history of malignant neoplasm of breast: Secondary | ICD-10-CM | POA: Diagnosis not present

## 2018-10-02 DIAGNOSIS — R0602 Shortness of breath: Secondary | ICD-10-CM

## 2018-10-02 DIAGNOSIS — I110 Hypertensive heart disease with heart failure: Secondary | ICD-10-CM | POA: Diagnosis present

## 2018-10-02 DIAGNOSIS — R059 Cough, unspecified: Secondary | ICD-10-CM

## 2018-10-02 DIAGNOSIS — Z79899 Other long term (current) drug therapy: Secondary | ICD-10-CM

## 2018-10-02 DIAGNOSIS — R34 Anuria and oliguria: Secondary | ICD-10-CM

## 2018-10-02 DIAGNOSIS — M4850XS Collapsed vertebra, not elsewhere classified, site unspecified, sequela of fracture: Secondary | ICD-10-CM | POA: Diagnosis present

## 2018-10-02 DIAGNOSIS — D630 Anemia in neoplastic disease: Secondary | ICD-10-CM | POA: Diagnosis present

## 2018-10-02 DIAGNOSIS — Z87891 Personal history of nicotine dependence: Secondary | ICD-10-CM

## 2018-10-02 DIAGNOSIS — E871 Hypo-osmolality and hyponatremia: Secondary | ICD-10-CM | POA: Diagnosis not present

## 2018-10-02 DIAGNOSIS — R110 Nausea: Secondary | ICD-10-CM | POA: Diagnosis not present

## 2018-10-02 DIAGNOSIS — K589 Irritable bowel syndrome without diarrhea: Secondary | ICD-10-CM | POA: Diagnosis present

## 2018-10-02 DIAGNOSIS — R079 Chest pain, unspecified: Secondary | ICD-10-CM | POA: Diagnosis not present

## 2018-10-02 DIAGNOSIS — R5381 Other malaise: Secondary | ICD-10-CM | POA: Diagnosis not present

## 2018-10-02 DIAGNOSIS — S31502A Unspecified open wound of unspecified external genital organs, female, initial encounter: Secondary | ICD-10-CM | POA: Diagnosis not present

## 2018-10-02 DIAGNOSIS — R4182 Altered mental status, unspecified: Secondary | ICD-10-CM | POA: Diagnosis present

## 2018-10-02 DIAGNOSIS — C773 Secondary and unspecified malignant neoplasm of axilla and upper limb lymph nodes: Secondary | ICD-10-CM | POA: Diagnosis not present

## 2018-10-02 DIAGNOSIS — E785 Hyperlipidemia, unspecified: Secondary | ICD-10-CM | POA: Diagnosis not present

## 2018-10-02 DIAGNOSIS — Z79891 Long term (current) use of opiate analgesic: Secondary | ICD-10-CM

## 2018-10-02 DIAGNOSIS — C801 Malignant (primary) neoplasm, unspecified: Secondary | ICD-10-CM | POA: Diagnosis not present

## 2018-10-02 DIAGNOSIS — Z803 Family history of malignant neoplasm of breast: Secondary | ICD-10-CM

## 2018-10-02 DIAGNOSIS — C7951 Secondary malignant neoplasm of bone: Secondary | ICD-10-CM | POA: Diagnosis not present

## 2018-10-02 DIAGNOSIS — Z905 Acquired absence of kidney: Secondary | ICD-10-CM

## 2018-10-02 DIAGNOSIS — K5900 Constipation, unspecified: Secondary | ICD-10-CM | POA: Diagnosis present

## 2018-10-02 LAB — COMPREHENSIVE METABOLIC PANEL
ALT: 24 U/L (ref 0–44)
AST: 69 U/L — AB (ref 15–41)
Albumin: 3.2 g/dL — ABNORMAL LOW (ref 3.5–5.0)
Alkaline Phosphatase: 124 U/L (ref 38–126)
Anion gap: 10 (ref 5–15)
BILIRUBIN TOTAL: 0.8 mg/dL (ref 0.3–1.2)
BUN: 27 mg/dL — AB (ref 8–23)
CHLORIDE: 91 mmol/L — AB (ref 98–111)
CO2: 31 mmol/L (ref 22–32)
CREATININE: 0.97 mg/dL (ref 0.44–1.00)
Calcium: 14.3 mg/dL (ref 8.9–10.3)
GFR calc Af Amer: 59 mL/min — ABNORMAL LOW (ref >60)
GFR calc non Af Amer: 51 mL/min — ABNORMAL LOW (ref >60)
Glucose, Bld: 115 mg/dL — ABNORMAL HIGH (ref 70–99)
Potassium: 3.6 mmol/L (ref 3.5–5.1)
Sodium: 132 mmol/L — ABNORMAL LOW (ref 135–145)
Total Protein: 7 g/dL (ref 6.5–8.1)

## 2018-10-02 LAB — CBC WITH DIFFERENTIAL/PLATELET
ABS IMMATURE GRANULOCYTES: 0.4 10*3/uL — AB (ref 0.00–0.07)
Basophils Absolute: 0.1 10*3/uL (ref 0.0–0.1)
Basophils Relative: 1 %
Eosinophils Absolute: 0.2 10*3/uL (ref 0.0–0.5)
Eosinophils Relative: 2 %
HEMATOCRIT: 34 % — AB (ref 36.0–46.0)
HEMOGLOBIN: 10.5 g/dL — AB (ref 12.0–15.0)
IMMATURE GRANULOCYTES: 3 %
Lymphocytes Relative: 10 %
Lymphs Abs: 1.3 10*3/uL (ref 0.7–4.0)
MCH: 26.2 pg (ref 26.0–34.0)
MCHC: 30.9 g/dL (ref 30.0–36.0)
MCV: 84.8 fL (ref 80.0–100.0)
Monocytes Absolute: 1 10*3/uL (ref 0.1–1.0)
Monocytes Relative: 8 %
NEUTROS ABS: 9.9 10*3/uL — AB (ref 1.7–7.7)
NEUTROS PCT: 76 %
NRBC: 0 % (ref 0.0–0.2)
PLATELETS: 323 10*3/uL (ref 150–400)
RBC: 4.01 MIL/uL (ref 3.87–5.11)
RDW: 16.2 % — ABNORMAL HIGH (ref 11.5–15.5)
WBC: 12.9 10*3/uL — AB (ref 4.0–10.5)

## 2018-10-02 LAB — URINALYSIS, ROUTINE W REFLEX MICROSCOPIC
Bacteria, UA: NONE SEEN
Bilirubin Urine: NEGATIVE
Glucose, UA: NEGATIVE mg/dL
Hgb urine dipstick: NEGATIVE
KETONES UR: 5 mg/dL — AB
Nitrite: NEGATIVE
PROTEIN: NEGATIVE mg/dL
SPECIFIC GRAVITY, URINE: 1.011 (ref 1.005–1.030)
pH: 8 (ref 5.0–8.0)

## 2018-10-02 LAB — LACTATE DEHYDROGENASE: LDH: 184 U/L (ref 98–192)

## 2018-10-02 LAB — PROCALCITONIN

## 2018-10-02 LAB — C-REACTIVE PROTEIN: CRP: 3.5 mg/dL — ABNORMAL HIGH (ref <1.0)

## 2018-10-02 MED ORDER — ZOLEDRONIC ACID 4 MG/5ML IV CONC
4.0000 mg | Freq: Once | INTRAVENOUS | Status: AC
  Administered 2018-10-02: 4 mg via INTRAVENOUS
  Filled 2018-10-02: qty 5

## 2018-10-02 MED ORDER — SODIUM CHLORIDE 0.9 % IV SOLN: INTRAVENOUS | Status: AC

## 2018-10-02 MED ORDER — TRAMADOL HCL 50 MG PO TABS
50.0000 mg | ORAL_TABLET | Freq: Four times a day (QID) | ORAL | Status: DC | PRN
  Administered 2018-10-02 – 2018-10-06 (×11): 50 mg via ORAL
  Filled 2018-10-02 (×11): qty 1

## 2018-10-02 MED ORDER — ASPIRIN EC 81 MG PO TBEC
81.0000 mg | DELAYED_RELEASE_TABLET | Freq: Every day | ORAL | Status: DC
  Administered 2018-10-03 – 2018-10-09 (×5): 81 mg via ORAL
  Filled 2018-10-02 (×5): qty 1

## 2018-10-02 MED ORDER — ACETAMINOPHEN 325 MG PO TABS
650.0000 mg | ORAL_TABLET | Freq: Four times a day (QID) | ORAL | Status: DC | PRN
  Administered 2018-10-03 – 2018-10-07 (×4): 650 mg via ORAL
  Filled 2018-10-02 (×4): qty 2

## 2018-10-02 MED ORDER — SODIUM CHLORIDE 0.9 % IV SOLN: INTRAVENOUS | Status: DC

## 2018-10-02 MED ORDER — HYDRALAZINE HCL 20 MG/ML IJ SOLN
5.0000 mg | INTRAMUSCULAR | Status: DC | PRN
  Administered 2018-10-02: 5 mg via INTRAVENOUS
  Filled 2018-10-02 (×2): qty 1

## 2018-10-02 MED ORDER — ONDANSETRON HCL 4 MG/2ML IJ SOLN
4.0000 mg | Freq: Four times a day (QID) | INTRAMUSCULAR | Status: DC | PRN
  Administered 2018-10-03 – 2018-10-09 (×3): 4 mg via INTRAVENOUS
  Filled 2018-10-02 (×3): qty 2

## 2018-10-02 MED ORDER — SODIUM CHLORIDE 0.9 % IV SOLN
INTRAVENOUS | Status: DC
  Administered 2018-10-02: 17:00:00 via INTRAVENOUS

## 2018-10-02 MED ORDER — ACETAMINOPHEN 650 MG RE SUPP: 650.0000 mg | Freq: Four times a day (QID) | RECTAL | Status: DC | PRN

## 2018-10-02 MED ORDER — SODIUM CHLORIDE 0.9 % IV BOLUS
1000.0000 mL | Freq: Once | INTRAVENOUS | Status: AC
  Administered 2018-10-02: 1000 mL via INTRAVENOUS

## 2018-10-02 MED ORDER — ENOXAPARIN SODIUM 30 MG/0.3ML ~~LOC~~ SOLN
30.0000 mg | Freq: Every day | SUBCUTANEOUS | Status: DC
  Administered 2018-10-02 – 2018-10-03 (×2): 30 mg via SUBCUTANEOUS
  Filled 2018-10-02 (×2): qty 0.3

## 2018-10-02 NOTE — ED Notes (Signed)
ED TO INPATIENT HANDOFF REPORT  Name/Age/Gender [name] 84 y.o. female  Code Status    Code Status Orders  (From admission, onward)         Start     Ordered   10/02/18 2039  Do not attempt resuscitation (DNR)  Continuous    Question Answer Comment  In the event of cardiac or respiratory ARREST Do not call a "code blue"   In the event of cardiac or respiratory ARREST Do not perform Intubation, CPR, defibrillation or ACLS   In the event of cardiac or respiratory ARREST Use medication by any route, position, wound care, and other measures to relive pain and suffering. May use oxygen, suction and manual treatment of airway obstruction as needed for comfort.      10/02/18 2038        Code Status History    Date Active Date Inactive Code Status Order ID Comments User Context   10/02/2018 2016 10/02/2018 2038 Full Code [id number]  Shela Leff, MD ED      Home/SNF/Other Home  Chief Complaint AMS acute   Level of Care/Admitting Diagnosis ED Disposition    ED Disposition Condition Comment   Admit  Hospital Area: [facility] [[id number]]  Level of Care: Med-Surg [16]  Diagnosis: Hypercalcemia [275.42.ICD-9-CM]  Admitting Physician: Shela Leff [[id number]]  Attending Physician: Shela Leff [[id number]]  Estimated length of stay: past midnight tomorrow  Certification:: I certify this patient will need inpatient services for at least 2 midnights  PT Class (Do Not Modify): Inpatient [101]  PT Acc Code (Do Not Modify): Private [1]       Medical History Past Medical History:  Diagnosis Date  . Barrett esophagus   . Hx of skin cancer, basal cell   . HX: breast cancer   . Hyperlipemia   . Hypertension   . IBS (irritable bowel syndrome)   . Neck pain   . Osteoarthritis   . Osteoporosis   . Prediabetes     Allergies No Known Allergies  IV Location/Drains/Wounds Patient Lines/Drains/Airways Status   Active Line/Drains/Airways     Name:   Placement date:   Placement time:   Site:   Days:   Peripheral IV 10/02/18 Right;Lateral Forearm   10/02/18    -    Forearm   less than 1          Labs/Imaging Results for orders placed or performed during the hospital encounter of 10/02/18 (from the past 48 hour(s))  CBC with Differential/Platelet     Status: Abnormal   Collection Time: 10/02/18  3:29 PM  Result Value Ref Range   WBC 12.9 (H) 4.0 - 10.5 K/uL   RBC 4.01 3.87 - 5.11 MIL/uL   Hemoglobin 10.5 (L) 12.0 - 15.0 g/dL   HCT 34.0 (L) 36.0 - 46.0 %   MCV 84.8 80.0 - 100.0 fL   MCH 26.2 26.0 - 34.0 pg   MCHC 30.9 30.0 - 36.0 g/dL   RDW 16.2 (H) 11.5 - 15.5 %   Platelets 323 150 - 400 K/uL   nRBC 0.0 0.0 - 0.2 %   Neutrophils Relative % 76 %   Neutro Abs 9.9 (H) 1.7 - 7.7 K/uL   Lymphocytes Relative 10 %   Lymphs Abs 1.3 0.7 - 4.0 K/uL   Monocytes Relative 8 %   Monocytes Absolute 1.0 0.1 - 1.0 K/uL   Eosinophils Relative 2 %   Eosinophils Absolute 0.2 0.0 - 0.5 K/uL   Basophils  Relative 1 %   Basophils Absolute 0.1 0.0 - 0.1 K/uL   Immature Granulocytes 3 %   Abs Immature Granulocytes 0.40 (H) 0.00 - 0.07 K/uL    Comment: Performed at [facility], [place] [address], [place], [place] [address]  Comprehensive metabolic panel     Status: Abnormal   Collection Time: 10/02/18  3:29 PM  Result Value Ref Range   Sodium 132 (L) 135 - 145 mmol/L   Potassium 3.6 3.5 - 5.1 mmol/L   Chloride 91 (L) 98 - 111 mmol/L   CO2 31 22 - 32 mmol/L   Glucose, Bld 115 (H) 70 - 99 mg/dL   BUN 27 (H) 8 - 23 mg/dL   Creatinine, Ser 0.97 0.44 - 1.00 mg/dL   Calcium 14.3 (HH) 8.9 - 10.3 mg/dL    Comment: CRITICAL RESULT CALLED TO, READ BACK BY AND VERIFIED WITH: B.JESSEE,RN [id number] BY V.WILKINS    Total Protein 7.0 6.5 - 8.1 g/dL   Albumin 3.2 (L) 3.5 - 5.0 g/dL   AST 69 (H) 15 - 41 U/L   ALT 24 0 - 44 U/L   Alkaline Phosphatase 124 38 - 126 U/L   Total Bilirubin 0.8 0.3 - 1.2 mg/dL   GFR calc non Af  Amer 51 (L) >60 mL/min   GFR calc Af Amer 59 (L) >60 mL/min    Comment: (NOTE) The eGFR has been calculated using the CKD EPI equation. This calculation has not been validated in all clinical situations. eGFR's persistently <60 mL/min signify possible Chronic Kidney Disease.    Anion gap 10 5 - 15    Comment: Performed at [facility], [place] [address] [place], [place] [address]  Urinalysis, Routine w reflex microscopic     Status: Abnormal   Collection Time: 10/02/18  7:22 PM  Result Value Ref Range   Color, Urine YELLOW YELLOW   APPearance CLEAR CLEAR   Specific Gravity, Urine 1.011 1.005 - 1.030   pH 8.0 5.0 - 8.0   Glucose, UA NEGATIVE NEGATIVE mg/dL   Hgb urine dipstick NEGATIVE NEGATIVE   Bilirubin Urine NEGATIVE NEGATIVE   Ketones, ur 5 (A) NEGATIVE mg/dL   Protein, ur NEGATIVE NEGATIVE mg/dL   Nitrite NEGATIVE NEGATIVE   Leukocytes, UA MODERATE (A) NEGATIVE   RBC / HPF 0-5 0 - 5 RBC/hpf   WBC, UA 0-5 0 - 5 WBC/hpf   Bacteria, UA NONE SEEN NONE SEEN   Squamous Epithelial / LPF 0-5 0 - 5   Mucus PRESENT    Triple Phosphate Crystal PRESENT    Crystals PRESENT (A) NEGATIVE    Comment: Performed at [facility], [place] [address], [place], [place] [address]   Dg Chest 2 View  Result Date: 10/02/2018 CLINICAL DATA:  Chest pain. EXAM: CHEST - 2 VIEW COMPARISON:  Radiographs of September 07, 2018. FINDINGS: Stable cardiomediastinal silhouette. No pneumothorax is noted. Mild left pleural effusion is noted with probable underlying atelectasis or infiltrate. Minimal right pleural effusion is noted. Bony thorax is unremarkable. IMPRESSION: Mild left pleural effusion is noted with probable underlying atelectasis or infiltrate. Minimal right pleural effusion. Electronically Signed   By: Marijo Conception, M.D.   On: 10/02/2018 16:22   Ct Head Wo Contrast  Result Date: 10/02/2018 CLINICAL DATA:  Patient with chest pain.  Nausea. EXAM: CT HEAD WITHOUT  CONTRAST TECHNIQUE: Contiguous axial images were obtained from the base of the skull through the vertex without intravenous contrast. COMPARISON:  None. FINDINGS: Brain: Ventricles and sulci are appropriate for patient's age. No evidence for acute cortically based infarct, intracranial hemorrhage, mass lesion or mass-effect. Vascular: Unremarkable Skull: Intact. Sinuses/Orbits: Paranasal sinuses well aerated. Mastoid air cells unremarkable. Orbits unremarkable. Other: None. IMPRESSION: No acute intracranial process. Electronically Signed   By: Lovey Newcomer M.D.   On: 10/02/2018 16:59   Ct Pelvis Wo Contrast  Result Date: 10/02/2018 CLINICAL DATA:  Left sacroiliac joint pain. EXAM: CT PELVIS WITHOUT CONTRAST TECHNIQUE: Multidetector CT imaging of the pelvis was performed following the standard protocol without intravenous contrast. COMPARISON:  CT scan of February 17, 2014. FINDINGS: Urinary Tract: Wall thickening is seen involving superior portion of urinary bladder. Bowel: Sigmoid diverticulosis is noted. There is no evidence of bowel obstruction. Wall thickening of distal sigmoid colon and rectum is noted with surrounding inflammation consistent with proctocolitis or possibly neoplasm. Vascular/Lymphatic: Atherosclerosis of abdominal aorta is noted. Left inguinal lymph node measuring 1.1 cm is noted. 9 mm left common iliac lymph node is noted. Reproductive: No mass or other significant abnormality. Status post hysterectomy. Other:  No hernia or abnormal fluid collection is noted. Musculoskeletal: Multiple lytic lesions are seen in the pelvis and sacrum, consistent with metastatic disease. Largest lesion is seen posteriorly in right sacrum. IMPRESSION: Multiple lytic lesions are noted in the pelvis and sacrum, with the largest seen posteriorly in right sacrum. These are consistent with metastatic disease or possibly multiple myeloma. Wall thickening of distal sigmoid colon and rectum is noted with surrounding  inflammation most consistent with proctocolitis, although neoplasm cannot be excluded. Sigmoidoscopy is recommended for further evaluation. Left inguinal and common iliac adenopathy is noted which may be inflammatory in etiology, but malignancy cannot be excluded. Wall thickening is seen involving the superior portion of urinary bladder; neoplasm cannot be excluded and cystoscopy is recommended. Electronically Signed   By: Marijo Conception, M.D.   On: 10/02/2018 17:06   None  Pending Labs Unresulted Labs (From admission, onward)    Start     Ordered   10/03/18 0500  Comprehensive metabolic panel  Tomorrow morning,   R     10/02/18 2016   10/03/18 0500  CBC  Tomorrow morning,   R     10/02/18 2016   10/02/18 2044  Procalcitonin - Baseline  STAT - ONCE,   STAT     10/02/18 2043   10/02/18 2016  Protein electrophoresis, serum  Once,   R     10/02/18 2016   10/02/18 2016  Immunofixation electrophoresis  Once,   R     10/02/18 2016   10/02/18 2016  UPEP/UIFE/Light Chains/TP, 24-Hr Ur  Once,   R     10/02/18 2016   10/02/18 2015  Calcium, ionized  Once,   R     10/02/18 2016   10/02/18 2015  Lactate dehydrogenase  Once,   R     10/02/18 2016   10/02/18 2015  Beta 2 microglobulin, serum  Once,   R     10/02/18 2016   10/02/18 2015  C-reactive protein  Once,   R     10/02/18 2016   10/02/18 2015  Kappa/lambda light chains  Once,   R     10/02/18 2016   10/02/18 1529  Urine Culture  STAT - ONCE,   STAT     10/02/18 1529          Vitals/Pain Today's Vitals   10/02/18 1411 10/02/18 1629 10/02/18 1923 10/02/18 2045  BP:  (!) 166/60 (!) 185/75 (!) 168/74  Pulse:  92 100 98  Resp:  '18 16 16  '$ Temp:  97.8 F (36.6 C)    TempSrc:  Oral    SpO2:  94% 95% 97%  Weight: 53.5 kg     Height: '5\' 3"'$  (1.6 m)     PainSc:    8     Isolation Precautions No active isolations  Medications Medications  0.9 %  sodium chloride infusion ( Intravenous New Bag/Given 10/02/18 1706)  0.9 %  sodium  chloride infusion ( Intravenous Transfusing/Transfer 10/02/18 2021)  aspirin EC tablet 81 mg (has no administration in time range)  enoxaparin (LOVENOX) injection 30 mg (has no administration in time range)  ondansetron (ZOFRAN) injection 4 mg (has no administration in time range)  traMADol (ULTRAM) tablet 50 mg (50 mg Oral Given 10/02/18 2058)  acetaminophen (TYLENOL) tablet 650 mg (has no administration in time range)    Or  acetaminophen (TYLENOL) suppository 650 mg (has no administration in time range)  zolendronic acid (ZOMETA) 4 mg in sodium chloride 0.9 % 100 mL IVPB (has no administration in time range)  hydrALAZINE (APRESOLINE) injection 5 mg (5 mg Intravenous Given 10/02/18 2059)  sodium chloride 0.9 % bolus 1,000 mL (1,000 mLs Intravenous New Bag/Given 10/02/18 1838)    Mobility walks with device

## 2018-10-02 NOTE — ED Notes (Signed)
Patient transported to CT 

## 2018-10-02 NOTE — ED Triage Notes (Signed)
It has been 2 weeks since being treated for SI joint pain of unknown reason. She had  Hydrocodone prior to the this for low back oain, but with new severe pain in the SI joint she was placed on oxycodone and diclofenac. She has recently cut back on oxycodone. Since, she has not been feeling well and family noted confusion. Also decreased ability to perform ADL's, difficulty thinking and communicating were noted by the family.Normally uses a walker but now uses with dificulty. Last night she woke up confused in the middle of the night. She has had increased pain across the chest the last few days.

## 2018-10-02 NOTE — ED Notes (Signed)
Dr Marlowe Sax notified of pt BP PRN orders to be written

## 2018-10-02 NOTE — H&P (Signed)
History and Physical    [name]:[id number] DOB: October 03, 1929 DOA: 10/02/2018  PCP: Janie Morning, DO Patient coming from: Home  Chief Complaint: Altered mental status  HPI: [name] is a 84 y.o. female with medical history significant of h/o skin cancer, h/o breast cancer, hypertension, hyperlipidemia presenting to the hospital for evaluation of altered mental status per family.  Daughter at bedside states patient seemed confused earlier today and was not able to pick up a bowl to feed herself.  She did not notice any slurring of speech, facial droop, or focal weakness.  States patient has been complaining of left-sided sacral pain for the past 2 weeks.  She does have a history of chronic back pain due to compression fractures.  Patient has been feeling nauseous but has not vomited.  She denies having any abdominal pain.  States she took oxycodone this morning for her chronic back pain but has taken this medication before.  ED Course: Hemodynamically stable.  White count 12.9.  Hemoglobin 10.5 with normal MCV, no recent baseline.  Corrected calcium 14.9.  Chest x-ray showing mild left pleural effusion with probable underlying atelectasis or infiltrate and minimal right pleural effusion.  CT head negative for acute intracranial process.  CT pelvis showing multiple lytic lesions in the pelvis and sacrum, with the largest seen posteriorly in the right sacrum.  These are thought to be consistent with metastatic disease or possibly multiple myeloma.  CT also showing wall thickening of distal sigmoid colon and rectum with surrounding inflammation most consistent with proctocolitis although neoplasm cannot be excluded.  There is left inguinal and common iliac adenopathy, malignancy cannot be excluded.  There is wall thickening in the superior portion of the urinary bladder, neoplasm cannot be excluded.  Patient received 1 L normal saline bolus in the ED.  TR[facility] paged to admit.  Review of  Systems: As per HPI otherwise 10 point review of systems negative.  Past Medical History:  Diagnosis Date  . Barrett esophagus   . Hx of skin cancer, basal cell   . HX: breast cancer   . Hyperlipemia   . Hypertension   . IBS (irritable bowel syndrome)   . Neck pain   . Osteoarthritis   . Osteoporosis   . Prediabetes     Past Surgical History:  Procedure Laterality Date  . ABDOMINAL HYSTERECTOMY    . APPENDECTOMY    . Basal Cell Carcinoma with focal sclerosis resection     Left shin  . CATARACT EXTRACTION Bilateral   . CHOLECYSTECTOMY     1975  . MASTECTOMY Left    2001  . NEPHRECTOMY Right    1955     reports that she has quit smoking. Her smoking use included cigarettes. She has never used smokeless tobacco. She reports that she drinks alcohol. She reports that she does not use drugs.  No Known Allergies  Family History  Problem Relation Age of Onset  . Congestive Heart Failure Mother   . Pneumonia Father   . Dementia Father     Prior to Admission medications   Medication Sig Start Date End Date Taking? Authorizing Provider  Ascorbic Acid (VITAMIN C) 1000 MG tablet Take 1,000 mg by mouth daily.   Yes [provider]  aspirin EC 81 MG tablet Take 81 mg by mouth daily.   Yes [provider]  Biotin (BIOTIN MAXIMUM STRENGTH) 10 MG TABS Take by mouth daily.   Yes [provider]  Cholecalciferol (VITAMIN  D-3 PO) Take by mouth daily.   Yes [provider]  hydrochlorothiazide (HYDRODIURIL) 12.5 MG tablet Take 12.5 mg by mouth daily.   Yes [provider]  losartan-hydrochlorothiazide (HYZAAR) 100-12.5 MG tablet Take 1 tablet by mouth daily. 08/06/18  Yes [provider]  Magnesium 400 MG CAPS Take by mouth daily.   Yes [provider]  Omega-3 Fatty Acids (FISH OIL) 1000 MG CAPS Take by mouth daily.   Yes [provider]  oxyCODONE-acetaminophen (PERCOCET) 5-325 MG tablet Take 1 tablet by mouth  every 8 (eight) hours as needed for severe pain. 08/28/15  Yes Marcial Pacas, MD  TURMERIC PO Take by mouth daily.   Yes [provider]    Physical Exam: Vitals:   10/02/18 1408 10/02/18 1411 10/02/18 1629 10/02/18 1923  BP: (!) 152/62  (!) 166/60 (!) 185/75  Pulse: 88  92 100  Resp: '18  18 16  '$ Temp: 97.6 F (36.4 C)  97.8 F (36.6 C)   TempSrc: Oral  Oral   SpO2: 95%  94% 95%  Weight:  53.5 kg    Height:  '5\' 3"'$  (1.6 m)      Physical Exam  Constitutional: She appears well-developed and well-nourished. No distress.  HENT:  Head: Normocephalic.  Mouth/Throat: Oropharynx is clear and moist.  Eyes: Pupils are equal, round, and reactive to light. EOM are normal. Right eye exhibits no discharge. Left eye exhibits no discharge.  Neck: Neck supple. No tracheal deviation present.  Cardiovascular: Normal rate, regular rhythm and intact distal pulses.  Murmur heard. Pulmonary/Chest: Effort normal and breath sounds normal. No respiratory distress. She has no wheezes. She has no rales.  Abdominal: Soft. Bowel sounds are normal. She exhibits no distension. There is no tenderness. There is no guarding.  Musculoskeletal: She exhibits no edema.  Neurological: No cranial nerve deficit.  Awake and alert Oriented to person and place No slurring of speech No facial droop Tongue midline Strength 5 out of 5 in bilateral upper and lower extremities Sensation to light touch intact throughout  Skin: Skin is warm and dry. She is not diaphoretic.  Psychiatric: She has a normal mood and affect.     Labs on Admission: I have personally reviewed following labs and imaging studies  CBC: Recent Labs  Lab 10/02/18 1529  WBC 12.9*  NEUTROABS 9.9*  HGB 10.5*  HCT 34.0*  MCV 84.8  PLT 694   Basic Metabolic Panel: Recent Labs  Lab 10/02/18 1529  NA 132*  K 3.6  CL 91*  CO2 31  GLUCOSE 115*  BUN 27*  CREATININE 0.97  CALCIUM 14.3*   GFR: Estimated Creatinine Clearance: 33.8  mL/min (by C-G formula based on SCr of 0.97 mg/dL). Liver Function Tests: Recent Labs  Lab 10/02/18 1529  AST 69*  ALT 24  ALKPHOS 124  BILITOT 0.8  PROT 7.0  ALBUMIN 3.2*   No results for input(s): LIPASE, AMYLASE in the last 168 hours. No results for input(s): AMMONIA in the last 168 hours. Coagulation Profile: No results for input(s): INR, PROTIME in the last 168 hours. Cardiac Enzymes: No results for input(s): CKTOTAL, CKMB, CKMBINDEX, TROPONINI in the last 168 hours. BNP (last 3 results) No results for input(s): PROBNP in the last 8760 hours. HbA1C: No results for input(s): HGBA1C in the last 72 hours. CBG: No results for input(s): GLUCAP in the last 168 hours. Lipid Profile: No results for input(s): CHOL, HDL, LDLCALC, TRIG, CHOLHDL, LDLDIRECT in the last 72 hours. Thyroid  Function Tests: No results for input(s): TSH, T4TOTAL, FREET4, T3FREE, THYROIDAB in the last 72 hours. Anemia Panel: No results for input(s): VITAMINB12, FOLATE, FERRITIN, TIBC, IRON, RETICCTPCT in the last 72 hours. Urine analysis: No results found for: COLORURINE, APPEARANCEUR, LABSPEC, PHURINE, GLUCOSEU, [place], BILIRUBINUR, KETONESUR, PROTEINUR, UROBILINOGEN, NITRITE, LEUKOCYTESUR  Radiological Exams on Admission: Dg Chest 2 View  Result Date: 10/02/2018 CLINICAL DATA:  Chest pain. EXAM: CHEST - 2 VIEW COMPARISON:  Radiographs of September 07, 2018. FINDINGS: Stable cardiomediastinal silhouette. No pneumothorax is noted. Mild left pleural effusion is noted with probable underlying atelectasis or infiltrate. Minimal right pleural effusion is noted. Bony thorax is unremarkable. IMPRESSION: Mild left pleural effusion is noted with probable underlying atelectasis or infiltrate. Minimal right pleural effusion. Electronically Signed   By: Marijo Conception, M.D.   On: 10/02/2018 16:22   Ct Head Wo Contrast  Result Date: 10/02/2018 CLINICAL DATA:  Patient with chest pain.  Nausea. EXAM: CT HEAD WITHOUT  CONTRAST TECHNIQUE: Contiguous axial images were obtained from the base of the skull through the vertex without intravenous contrast. COMPARISON:  None. FINDINGS: Brain: Ventricles and sulci are appropriate for patient's age. No evidence for acute cortically based infarct, intracranial hemorrhage, mass lesion or mass-effect. Vascular: Unremarkable Skull: Intact. Sinuses/Orbits: Paranasal sinuses well aerated. Mastoid air cells unremarkable. Orbits unremarkable. Other: None. IMPRESSION: No acute intracranial process. Electronically Signed   By: Lovey Newcomer M.D.   On: 10/02/2018 16:59   Ct Pelvis Wo Contrast  Result Date: 10/02/2018 CLINICAL DATA:  Left sacroiliac joint pain. EXAM: CT PELVIS WITHOUT CONTRAST TECHNIQUE: Multidetector CT imaging of the pelvis was performed following the standard protocol without intravenous contrast. COMPARISON:  CT scan of February 17, 2014. FINDINGS: Urinary Tract: Wall thickening is seen involving superior portion of urinary bladder. Bowel: Sigmoid diverticulosis is noted. There is no evidence of bowel obstruction. Wall thickening of distal sigmoid colon and rectum is noted with surrounding inflammation consistent with proctocolitis or possibly neoplasm. Vascular/Lymphatic: Atherosclerosis of abdominal aorta is noted. Left inguinal lymph node measuring 1.1 cm is noted. 9 mm left common iliac lymph node is noted. Reproductive: No mass or other significant abnormality. Status post hysterectomy. Other:  No hernia or abnormal fluid collection is noted. Musculoskeletal: Multiple lytic lesions are seen in the pelvis and sacrum, consistent with metastatic disease. Largest lesion is seen posteriorly in right sacrum. IMPRESSION: Multiple lytic lesions are noted in the pelvis and sacrum, with the largest seen posteriorly in right sacrum. These are consistent with metastatic disease or possibly multiple myeloma. Wall thickening of distal sigmoid colon and rectum is noted with surrounding  inflammation most consistent with proctocolitis, although neoplasm cannot be excluded. Sigmoidoscopy is recommended for further evaluation. Left inguinal and common iliac adenopathy is noted which may be inflammatory in etiology, but malignancy cannot be excluded. Wall thickening is seen involving the superior portion of urinary bladder; neoplasm cannot be excluded and cystoscopy is recommended. Electronically Signed   By: Marijo Conception, M.D.   On: 10/02/2018 17:06    Assessment/Plan Principal Problem:   Hypercalcemia Active Problems:   Acute metabolic encephalopathy   Anemia   Leukocytosis   Hyponatremia   Abnormal LFTs   Hypertension  Severe hypercalcemia in the setting of suspected malignancy Suspect multiple myeloma.  Corrected calcium 14.9.  Patient is awake, alert, and answering questions appropriately.  No lethargy or stupor.  Not complaining of abdominal pain.  Hemoglobin 10.5.  CT pelvis showing multiple lytic lesions in the pelvis and sacrum,  with the largest seen posteriorly in the right sacrum.  These are thought to be consistent with metastatic disease or possibly multiple myeloma.  CT also showing wall thickening of distal sigmoid colon and rectum with surrounding inflammation most consistent with proctocolitis although neoplasm cannot be excluded.  There is left inguinal and common iliac adenopathy, malignancy cannot be excluded.  There is wall thickening in the superior portion of the urinary bladder, neoplasm cannot be excluded.   -Patient received 1 L normal saline bolus in the ED. Will give IV zoledronic acid 4 mg once for severe hypercalcemia. Creatinine 0.9.  Discussed with Dr. Benay Spice from oncology.  He will see the patient in the morning. -Continue normal saline at 200 cc/h -Monitor intake and output -IV Zofran PRN nausea -Tramadol 50 mg every 6 hours as needed, Tylenol as needed for back pain -Check ionized calcium level -Repeat CMP in a.m. -LDH -Beta-2  microglobulin -CRP -Serum kappa/lambda light chains -SPEP with immunofixation -UPEP with immunofixation  Acute metabolic encephalopathy Likely related to severe hypercalcemia.  Patient uses oxycodone at home for chronic back pain which could also possibly be contributing. At present, she is awake, alert, and oriented to person place.  Answering questions appropriately.  No focal neuro deficits. CT head negative for acute intracranial process.   -Management of hypercalcemia as above  Anemia Hemoglobin 10.5 with normal MCV, no recent baseline.  In the setting of suspected multiple myeloma/malignancy. -Repeat CBC in a.m.  Mild leukocytosis White count 12.9. Chest x-ray showing mild left pleural effusion with probable underlying atelectasis or infiltrate and minimal right pleural effusion.  CT with evidence of possible proctocolitis. Patient is not complaining of any abdominal pain.  Abdominal exam benign.   -UA, urine culture pending  -Pneumonia less likely as patient is afebrile and has no respiratory complaints.  Satting well on room air. Appears nontoxic on exam. -Repeat CBC in a.m. -Check procalcitonin level  Mild hyponatremia Sodium 132. -Continue fluid resuscitation for severe hypercalcemia -Repeat labs in the morning  LFT abnormality AST 69, remainder of LFTs normal.  No recent baseline in the chart.  Patient is not complaining of any abdominal pain.  She is nauseous, which could be explained by severe hypercalcemia.  No episodes of vomiting. -Repeat CMP in a.m.  Hypertension -Hold home diuretic/ARB -IV hydralazine 5 mg every 4 hours as needed for SBP>160  DVT prophylaxis: Lovenox Code Status: Patient wishes to be DNR. Family Communication: Husband and daughter at bedside. Disposition Plan: Anticipate discharge to home in 1 to 2 days. Consults called: Oncology-Dr. Benay Spice Admission status: It is my clinical opinion that admission to [place] is reasonable and necessary in  this 84 y.o. female . presenting with symptoms of altered mental status, sacral pain, concerning for severe hypercalcemia due to suspected underlying malignancy . in the context of PMH including: History of breast cancer, history of skin cancer . and pertinent positives on radiographic and laboratory data including: Labs showing evidence of severe hypercalcemia.  CT with evidence of bone lytic lesions. . Workup and treatment include IV fluids, IV zoledronic acid.  Anticipate she will stay in the hospital for 1 to 2 days until her calcium level is corrected.  Pending oncology evaluation.   Given the aforementioned, the predictability of an adverse outcome is felt to be significant. I expect that the patient will require at least 2 midnights in the hospital to treat this condition.    Shela Leff MD [facility] Pager [phone]  If 7PM-7AM, please  contact night-coverage [email or web address] Password [email or web address]  10/02/2018, 8:44 PM

## 2018-10-02 NOTE — ED Provider Notes (Signed)
[place] DEPT Provider Note   CSN: [id number] Arrival date & time: 10/02/18  1341     History   Chief Complaint Chief Complaint  Patient presents with  . Altered Mental Status    HPI [name] is a 84 y.o. female.  84 year old female with history of chronic left-sided SI joint pain presents with 24 hours of decreased mental status according to her family.  Patient has been taking oxycodone 5 mg and I think this may be the cause.  She has not had any fever, cough, congestion.  She is had some urinary frequency but denies any dysuria.  No abdominal or chest discomfort.  Is currently being evaluated for her left SI joint pain which is why she is on the pain medication.  Denies any other pain medication use or medication changes.  Symptoms have been persistent but have been somewhat improving.  No treatment used prior to arrival.     Past Medical History:  Diagnosis Date  . Barrett esophagus   . Hx of skin cancer, basal cell   . HX: breast cancer   . Hyperlipemia   . Hypertension   . IBS (irritable bowel syndrome)   . Neck pain   . Osteoarthritis   . Osteoporosis   . Prediabetes     Patient Active Problem List   Diagnosis Date Noted  . Neck pain on left side 10/01/2015    Past Surgical History:  Procedure Laterality Date  . ABDOMINAL HYSTERECTOMY    . APPENDECTOMY    . Basal Cell Carcinoma with focal sclerosis resection     Left shin  . CATARACT EXTRACTION Bilateral   . CHOLECYSTECTOMY     1975  . MASTECTOMY Left    2001  . NEPHRECTOMY Right    1955     OB History   None      Home Medications    Prior to Admission medications   Medication Sig Start Date End Date Taking? Authorizing Provider  Ascorbic Acid (VITAMIN C) 1000 MG tablet Take 1,000 mg by mouth daily.    [provider]  aspirin EC 81 MG tablet Take 81 mg by mouth daily.    [provider]  Biotin (BIOTIN MAXIMUM STRENGTH) 10 MG TABS  Take by mouth daily.    [provider]  celecoxib (CELEBREX) 100 MG capsule Take 1 capsule (100 mg total) by mouth 2 (two) times daily. 08/13/15   Marcial Pacas, MD  Cholecalciferol (VITAMIN D-3 PO) Take by mouth daily.    [provider]  HYDROcodone-acetaminophen (NORCO/VICODIN) 5-325 MG tablet Take 1 tablet by mouth every 6 (six) hours as needed for moderate pain. 10/01/15   Marcial Pacas, MD  Magnesium 400 MG CAPS Take by mouth daily.    [provider]  METFORMIN HCL ER PO Take by mouth. Take '250mg'$  daily.    [provider]  Omega-3 Fatty Acids (FISH OIL) 1000 MG CAPS Take by mouth daily.    [provider]  oxyCODONE-acetaminophen (PERCOCET) 5-325 MG tablet Take 1 tablet by mouth every 8 (eight) hours as needed for severe pain. 08/28/15   Marcial Pacas, MD  pantoprazole (PROTONIX) 40 MG tablet Take by mouth daily.    [provider]  pravastatin (PRAVACHOL) 20 MG tablet Take 20 mg by mouth daily.    [provider]  predniSONE (DELTASONE) 10 MG tablet One po qam x2 week, then 1/2 tab po qam 08/28/15   Marcial Pacas, MD  TURMERIC PO Take by mouth daily.    [provider]  VALSARTAN-HYDROCHLOROTHIAZIDE PO Take by mouth daily.    [provider]    Family History Family History  Problem Relation Age of Onset  . Congestive Heart Failure Mother   . Pneumonia Father   . Dementia Father     Social History Social History   Tobacco Use  . Smoking status: Former Smoker    Types: Cigarettes  . Smokeless tobacco: Never Used  . Tobacco comment: Quit 1970  Substance Use Topics  . Alcohol use: Yes    Alcohol/week: 0.0 standard drinks    Comment: Occasional glass of wine  . Drug use: No     Allergies   Patient has no known allergies.   Review of Systems Review of Systems  All other systems reviewed and are negative.    Physical Exam Updated Vital Signs BP (!) 152/62 (BP Location: Right Arm)   Pulse 88   Temp  97.6 F (36.4 C) (Oral)   Resp 18   Ht 1.6 m ('5\' 3"'$ )   Wt 53.5 kg   SpO2 95%   BMI 20.90 kg/m   Physical Exam  Constitutional: She is oriented to person, place, and time. She appears well-developed and well-nourished.  Non-toxic appearance. No distress.  HENT:  Head: Normocephalic and atraumatic.  Eyes: Pupils are equal, round, and reactive to light. Conjunctivae, EOM and lids are normal.  Neck: Normal range of motion. Neck supple. No tracheal deviation present. No thyroid mass present.  Cardiovascular: Normal rate, regular rhythm and normal heart sounds. Exam reveals no gallop.  No murmur heard. Pulmonary/Chest: Effort normal and breath sounds normal. No stridor. No respiratory distress. She has no decreased breath sounds. She has no wheezes. She has no rhonchi. She has no rales.  Abdominal: Soft. Normal appearance and bowel sounds are normal. She exhibits no distension. There is no tenderness. There is no rebound and no CVA tenderness.  Musculoskeletal: Normal range of motion. She exhibits no edema or tenderness.  Neurological: She is alert and oriented to person, place, and time. She has normal strength. No cranial nerve deficit or sensory deficit. She displays a negative Romberg sign. GCS eye subscore is 4. GCS verbal subscore is 5. GCS motor subscore is 6.  Skin: Skin is warm and dry. No abrasion and no rash noted.  Psychiatric: Her speech is normal. Her affect is blunt. She is slowed.  Nursing note and vitals reviewed.    ED Treatments / Results  Labs (all labs ordered are listed, but only abnormal results are displayed) Labs Reviewed  URINE CULTURE  CBC WITH DIFFERENTIAL/PLATELET  COMPREHENSIVE METABOLIC PANEL  URINALYSIS, ROUTINE W REFLEX MICROSCOPIC    EKG None  Radiology No results found.  Procedures Procedures (including critical care time)  Medications Ordered in ED Medications  0.9 %  sodium chloride infusion (has no administration in time range)      Initial Impression / Assessment and Plan / ED Course  I have reviewed the triage vital signs and the nursing notes.  Pertinent labs & imaging results that were available during my care of the patient were reviewed by me and considered in my medical decision making (see chart for details).     Head CT without acute findings.  Patient has evidence of hypercalcemia.  Pelvic CT suspicious for metastasis versus multiple myeloma.  This is likely the cause of her hypercalcemia.  Will treat with IV fluids at this time.  Her mental  status is stable.  Will be admitted to the hospitalist service  Final Clinical Impressions(s) / ED Diagnoses   Final diagnoses:  Cough    ED Discharge Orders    None       Lacretia Leigh, MD 10/02/18 1949

## 2018-10-02 NOTE — ED Notes (Signed)
Date and time results received: 10/02/18 1827 (use smartphrase ".now" to insert current time)  Test: calcium Critical Value: 14.3  Name of Provider Notified: Dr Zenia Resides  Orders Received? Or Actions Taken?: Actions Taken: notified Dr Zenia Resides

## 2018-10-03 ENCOUNTER — Inpatient Hospital Stay (HOSPITAL_COMMUNITY): Payer: Medicare Other

## 2018-10-03 DIAGNOSIS — M899 Disorder of bone, unspecified: Secondary | ICD-10-CM

## 2018-10-03 DIAGNOSIS — J90 Pleural effusion, not elsewhere classified: Secondary | ICD-10-CM

## 2018-10-03 DIAGNOSIS — D72829 Elevated white blood cell count, unspecified: Secondary | ICD-10-CM

## 2018-10-03 DIAGNOSIS — E871 Hypo-osmolality and hyponatremia: Secondary | ICD-10-CM

## 2018-10-03 DIAGNOSIS — G9341 Metabolic encephalopathy: Secondary | ICD-10-CM

## 2018-10-03 DIAGNOSIS — R945 Abnormal results of liver function studies: Secondary | ICD-10-CM

## 2018-10-03 DIAGNOSIS — R34 Anuria and oliguria: Secondary | ICD-10-CM

## 2018-10-03 DIAGNOSIS — I10 Essential (primary) hypertension: Secondary | ICD-10-CM

## 2018-10-03 DIAGNOSIS — D649 Anemia, unspecified: Secondary | ICD-10-CM

## 2018-10-03 LAB — COMPREHENSIVE METABOLIC PANEL
ALK PHOS: 104 U/L (ref 38–126)
ALT: 24 U/L (ref 0–44)
ANION GAP: 9 (ref 5–15)
AST: 64 U/L — ABNORMAL HIGH (ref 15–41)
Albumin: 2.6 g/dL — ABNORMAL LOW (ref 3.5–5.0)
BUN: 25 mg/dL — ABNORMAL HIGH (ref 8–23)
CALCIUM: 13.3 mg/dL — AB (ref 8.9–10.3)
CO2: 27 mmol/L (ref 22–32)
CREATININE: 0.97 mg/dL (ref 0.44–1.00)
Chloride: 96 mmol/L — ABNORMAL LOW (ref 98–111)
GFR, EST AFRICAN AMERICAN: 59 mL/min — AB (ref >60)
GFR, EST NON AFRICAN AMERICAN: 51 mL/min — AB (ref >60)
Glucose, Bld: 108 mg/dL — ABNORMAL HIGH (ref 70–99)
Potassium: 3.5 mmol/L (ref 3.5–5.1)
SODIUM: 132 mmol/L — AB (ref 135–145)
TOTAL PROTEIN: 5.9 g/dL — AB (ref 6.5–8.1)
Total Bilirubin: 0.6 mg/dL (ref 0.3–1.2)

## 2018-10-03 LAB — CBC
HCT: 32 % — ABNORMAL LOW (ref 36.0–46.0)
Hemoglobin: 10.1 g/dL — ABNORMAL LOW (ref 12.0–15.0)
MCH: 26.6 pg (ref 26.0–34.0)
MCHC: 31.6 g/dL (ref 30.0–36.0)
MCV: 84.4 fL (ref 80.0–100.0)
NRBC: 0 % (ref 0.0–0.2)
PLATELETS: 320 10*3/uL (ref 150–400)
RBC: 3.79 MIL/uL — AB (ref 3.87–5.11)
RDW: 16.4 % — AB (ref 11.5–15.5)
WBC: 11.4 10*3/uL — ABNORMAL HIGH (ref 4.0–10.5)

## 2018-10-03 LAB — BASIC METABOLIC PANEL
Anion gap: 8 (ref 5–15)
BUN: 27 mg/dL — AB (ref 8–23)
CHLORIDE: 98 mmol/L (ref 98–111)
CO2: 26 mmol/L (ref 22–32)
CREATININE: 1.04 mg/dL — AB (ref 0.44–1.00)
Calcium: 12.6 mg/dL — ABNORMAL HIGH (ref 8.9–10.3)
GFR calc Af Amer: 54 mL/min — ABNORMAL LOW (ref >60)
GFR calc non Af Amer: 47 mL/min — ABNORMAL LOW (ref >60)
GLUCOSE: 96 mg/dL (ref 70–99)
POTASSIUM: 3.5 mmol/L (ref 3.5–5.1)
SODIUM: 132 mmol/L — AB (ref 135–145)

## 2018-10-03 LAB — CALCIUM, IONIZED: CALCIUM, IONIZED, SERUM: 8.4 mg/dL — AB (ref 4.5–5.6)

## 2018-10-03 MED ORDER — SODIUM CHLORIDE 0.9 % IV BOLUS
500.0000 mL | Freq: Once | INTRAVENOUS | Status: AC
  Administered 2018-10-03: 500 mL via INTRAVENOUS

## 2018-10-03 MED ORDER — POLYETHYLENE GLYCOL 3350 17 G PO PACK: 17.0000 g | PACK | Freq: Every day | ORAL | Status: DC | PRN

## 2018-10-03 MED ORDER — HYDROCODONE-ACETAMINOPHEN 5-325 MG PO TABS
1.0000 | ORAL_TABLET | Freq: Four times a day (QID) | ORAL | Status: DC | PRN
  Administered 2018-10-03 – 2018-10-04 (×2): 1 via ORAL
  Filled 2018-10-03 (×2): qty 1

## 2018-10-03 MED ORDER — IPRATROPIUM-ALBUTEROL 0.5-2.5 (3) MG/3ML IN SOLN
3.0000 mL | Freq: Four times a day (QID) | RESPIRATORY_TRACT | Status: DC
  Administered 2018-10-03: 3 mL via RESPIRATORY_TRACT
  Filled 2018-10-03: qty 3

## 2018-10-03 MED ORDER — MORPHINE SULFATE (PF) 2 MG/ML IV SOLN
0.5000 mg | Freq: Once | INTRAVENOUS | Status: DC
  Filled 2018-10-03: qty 1

## 2018-10-03 MED ORDER — SENNA 8.6 MG PO TABS
1.0000 | ORAL_TABLET | Freq: Two times a day (BID) | ORAL | Status: DC | PRN
  Administered 2018-10-04: 8.6 mg via ORAL
  Filled 2018-10-03: qty 1

## 2018-10-03 MED ORDER — SODIUM CHLORIDE 0.9 % IV BOLUS: 250.0000 mL | Freq: Once | INTRAVENOUS | Status: DC

## 2018-10-03 MED ORDER — IPRATROPIUM-ALBUTEROL 0.5-2.5 (3) MG/3ML IN SOLN
3.0000 mL | Freq: Two times a day (BID) | RESPIRATORY_TRACT | Status: DC
  Administered 2018-10-04 – 2018-10-06 (×4): 3 mL via RESPIRATORY_TRACT
  Filled 2018-10-03 (×3): qty 3

## 2018-10-03 NOTE — Progress Notes (Signed)
CRITICAL VALUE ALERT  Critical Value:  Calcium 13.3  Date & Time Notied:  10/03/18  0730  Provider Notified: Dr Alfredia Ferguson  Orders Received/Actions taken:

## 2018-10-03 NOTE — Consult Note (Addendum)
New Hematology/Oncology Consult   Requesting TZ:[id number]      Reason for Consult: Hypercalcemia, lytic bone lesions  HPI: [name] presented to the emergency room yesterday with altered mental status and "sacroiliac pain ".  Her daughter is at the bedside this morning. Her daughter reports [name] has been confused for approximately 1 week.  She has a history of chronic pain in the back.  She has increased pain in the sacral area recently.  Oxycodone has caused constipation.  In the emergency room a CT of the head showed no acute finding.  She was noted to have hypercalcemia.  A CT pelvis revealed a 1.1 cm left inguinal node and a 9 mm left common iliac node.  The lytic lesions were noted in the pelvis and sacrum consistent with metastatic disease.  The largest lesion was noted in the posterior right sacrum.  Wall thickening was noted at the distal sigmoid colon and rectum.  All thickening is noted in the superior bladder.  She received intravenous fluids and Zometa.    Past Medical History:  Diagnosis Date  . Barrett esophagus   . Hx of skin cancer, basal cell   . HX: breast cancer left mastectomy, DCIS  2001  . Hyperlipemia   . Hypertension   . IBS (irritable bowel syndrome)   . Neck pain-left neck pain beginning in 2016   . Osteoarthritis   . Osteoporosis   . Prediabetes   :  Past Surgical History:  Procedure Laterality Date  . ABDOMINAL HYSTERECTOMY    . APPENDECTOMY    . Basal Cell Carcinoma with focal sclerosis resection     Left shin  . CATARACT EXTRACTION Bilateral   . CHOLECYSTECTOMY     1975  . MASTECTOMY Left    2001  . NEPHRECTOMY Right    1955  :   Current Facility-Administered Medications:  .  0.9 %  sodium chloride infusion, , Intravenous, Continuous, Shela Leff, MD, Last Rate: 20 mL/hr at 10/02/18 1706 .  0.9 %  sodium chloride infusion, , Intravenous, Continuous, Shela Leff, MD .  acetaminophen (TYLENOL) tablet 650 mg, 650 mg, Oral,  Q6H PRN, 650 mg at 10/03/18 0202 **OR** acetaminophen (TYLENOL) suppository 650 mg, 650 mg, Rectal, Q6H PRN, Shela Leff, MD .  aspirin EC tablet 81 mg, 81 mg, Oral, Daily, Rathore, Vasundhra, MD .  enoxaparin (LOVENOX) injection 30 mg, 30 mg, Subcutaneous, QHS, Rathore, Wandra Feinstein, MD, 30 mg at 10/02/18 2246 .  hydrALAZINE (APRESOLINE) injection 5 mg, 5 mg, Intravenous, Q4H PRN, Shela Leff, MD, 5 mg at 10/02/18 2059 .  morphine 2 MG/ML injection 0.5 mg, 0.5 mg, Intravenous, Once, Rise Patience, MD .  ondansetron [facility]) injection 4 mg, 4 mg, Intravenous, Q6H PRN, Shela Leff, MD .  traMADol Veatrice Bourbon) tablet 50 mg, 50 mg, Oral, Q6H PRN, Shela Leff, MD, 50 mg at 10/03/18 0202:  . aspirin EC  81 mg Oral Daily  . enoxaparin (LOVENOX) injection  30 mg Subcutaneous QHS  .  morphine injection  0.5 mg Intravenous Once  :  No Known Allergies:  FH: Her daughter had breast cancer  SOCIAL HISTORY:  Review of Systems:  Positives include: Pain at the lower back, confusion, constipation when taking narcotics  A complete ROS was otherwise negative.   Physical Exam:  Blood pressure (!) 136/56, pulse 81, temperature 97.6 F (36.4 C), temperature source Oral, resp. rate 16, height _0  (1.6 m), weight 117 lb 15.1 oz (53.5 kg), SpO2 96 %.  HEENT: The tongue and mucous membranes are dry, no thrush, mass like fullness at the left lower neck and supraclavicular fossa Lungs: Decreased breath sounds at the lower chest bilaterally, no respiratory distress Cardiac: Regular rate and rhythm Abdomen: No hepatosplenomegaly, no mass, nontender  Vascular: No leg edema Lymph nodes: No cervical, supraclavicular, axillary, or inguinal nodes Neurologic: Alert, follows commands, moves all extremities, not oriented to day or date Skin: No rash Musculoskeletal: Tender at the upper sacrum  LABS:  Recent Labs    10/02/18 1529 10/03/18 0542  WBC 12.9* 11.4*  HGB 10.5* 10.1*   HCT 34.0* 32.0*  PLT 323 320    Recent Labs    10/02/18 1529 10/03/18 0542  NA 132* 132*  K 3.6 3.5  CL 91* 96*  CO2 31 27  GLUCOSE 115* 108*  BUN 27* 25*  CREATININE 0.97 0.97  CALCIUM 14.3* 13.3*      RADIOLOGY:  Dg Chest 2 View  Result Date: 10/02/2018 CLINICAL DATA:  Chest pain. EXAM: CHEST - 2 VIEW COMPARISON:  Radiographs of September 07, 2018. FINDINGS: Stable cardiomediastinal silhouette. No pneumothorax is noted. Mild left pleural effusion is noted with probable underlying atelectasis or infiltrate. Minimal right pleural effusion is noted. Bony thorax is unremarkable. IMPRESSION: Mild left pleural effusion is noted with probable underlying atelectasis or infiltrate. Minimal right pleural effusion. Electronically Signed   By: Marijo Conception, M.D.   On: 10/02/2018 16:22   Ct Head Wo Contrast  Result Date: 10/02/2018 CLINICAL DATA:  Patient with chest pain.  Nausea. EXAM: CT HEAD WITHOUT CONTRAST TECHNIQUE: Contiguous axial images were obtained from the base of the skull through the vertex without intravenous contrast. COMPARISON:  None. FINDINGS: Brain: Ventricles and sulci are appropriate for patient's age. No evidence for acute cortically based infarct, intracranial hemorrhage, mass lesion or mass-effect. Vascular: Unremarkable Skull: Intact. Sinuses/Orbits: Paranasal sinuses well aerated. Mastoid air cells unremarkable. Orbits unremarkable. Other: None. IMPRESSION: No acute intracranial process. Electronically Signed   By: Lovey Newcomer M.D.   On: 10/02/2018 16:59   Ct Pelvis Wo Contrast  Result Date: 10/02/2018 CLINICAL DATA:  Left sacroiliac joint pain. EXAM: CT PELVIS WITHOUT CONTRAST TECHNIQUE: Multidetector CT imaging of the pelvis was performed following the standard protocol without intravenous contrast. COMPARISON:  CT scan of February 17, 2014. FINDINGS: Urinary Tract: Wall thickening is seen involving superior portion of urinary bladder. Bowel: Sigmoid diverticulosis  is noted. There is no evidence of bowel obstruction. Wall thickening of distal sigmoid colon and rectum is noted with surrounding inflammation consistent with proctocolitis or possibly neoplasm. Vascular/Lymphatic: Atherosclerosis of abdominal aorta is noted. Left inguinal lymph node measuring 1.1 cm is noted. 9 mm left common iliac lymph node is noted. Reproductive: No mass or other significant abnormality. Status post hysterectomy. Other:  No hernia or abnormal fluid collection is noted. Musculoskeletal: Multiple lytic lesions are seen in the pelvis and sacrum, consistent with metastatic disease. Largest lesion is seen posteriorly in right sacrum. IMPRESSION: Multiple lytic lesions are noted in the pelvis and sacrum, with the largest seen posteriorly in right sacrum. These are consistent with metastatic disease or possibly multiple myeloma. Wall thickening of distal sigmoid colon and rectum is noted with surrounding inflammation most consistent with proctocolitis, although neoplasm cannot be excluded. Sigmoidoscopy is recommended for further evaluation. Left inguinal and common iliac adenopathy is noted which may be inflammatory in etiology, but malignancy cannot be excluded. Wall thickening is seen involving the superior portion of urinary bladder; neoplasm cannot be  excluded and cystoscopy is recommended. Electronically Signed   By: Marijo Conception, M.D.   On: 10/02/2018 17:06   US Venous Img Lower Bilateral  Result Date: 09/27/2018 CLINICAL DATA:  Bilateral lower extremity edema and pain. EXAM: BILATERAL LOWER EXTREMITY VENOUS DOPPLER ULTRASOUND TECHNIQUE: Gray-scale sonography with graded compression, as well as color Doppler and duplex ultrasound were performed to evaluate the lower extremity deep venous systems from the level of the common femoral vein and including the common femoral, femoral, profunda femoral, popliteal and calf veins including the posterior tibial, peroneal and gastrocnemius veins  when visible. The superficial great saphenous vein was also interrogated. Spectral Doppler was utilized to evaluate flow at rest and with distal augmentation maneuvers in the common femoral, femoral and popliteal veins. COMPARISON:  None. FINDINGS: RIGHT LOWER EXTREMITY Common Femoral Vein: No evidence of thrombus. Normal compressibility, respiratory phasicity and response to augmentation. Saphenofemoral Junction: No evidence of thrombus. Normal compressibility and flow on color Doppler imaging. Profunda Femoral Vein: No evidence of thrombus. Normal compressibility and flow on color Doppler imaging. Femoral Vein: No evidence of thrombus. Normal compressibility, respiratory phasicity and response to augmentation. Popliteal Vein: No evidence of thrombus. Normal compressibility, respiratory phasicity and response to augmentation. Calf Veins: No evidence of thrombus. Normal compressibility and flow on color Doppler imaging. Superficial Great Saphenous Vein: No evidence of thrombus. Normal compressibility. Venous Reflux:  None. Other Findings: No evidence of superficial thrombophlebitis or abnormal fluid collection. LEFT LOWER EXTREMITY Common Femoral Vein: No evidence of thrombus. Normal compressibility, respiratory phasicity and response to augmentation. Saphenofemoral Junction: No evidence of thrombus. Normal compressibility and flow on color Doppler imaging. Profunda Femoral Vein: No evidence of thrombus. Normal compressibility and flow on color Doppler imaging. Femoral Vein: No evidence of thrombus. Normal compressibility, respiratory phasicity and response to augmentation. Popliteal Vein: No evidence of thrombus. Normal compressibility, respiratory phasicity and response to augmentation. Calf Veins: No evidence of thrombus. Normal compressibility and flow on color Doppler imaging. Superficial Great Saphenous Vein: No evidence of thrombus. Normal compressibility. Venous Reflux:  None. Other Findings: No evidence of  superficial thrombophlebitis or abnormal fluid collection. IMPRESSION: No evidence of bilateral lower extremity deep venous thrombosis. Electronically Signed   By: Aletta Edouard M.D.   On: 09/27/2018 16:42    Assessment and Plan:   1.  Hypercalcemia 2.  Lytic bone lesions on CT pelvis 10/02/2018 3.  Rectal and bladder wall thickening on CT pelvis 10/02/2018 4.  History of left breast DCIS, status post a left mastectomy in 2001 5.  Altered mental status secondary to #1 6.  History of Barrett's esophagus 7.  Osteoporosis 8.  Nephrectomy in 1955 9.  Pain secondary to lytic bone lesions 10. Left neck mass 11. Anemia  [name] presented with symptomatic hypercalcemia.  She is alert today, but remains confused.  She received intravenous hydration and Zometa yesterday.  She will continue intravenous hydration.  The calcium level should improve over the next few days.  She was noted to have lytic bone lesions on the pelvic CT.  I discussed the differential diagnosis with [name] and her daughter.  She has a remote history of noninvasive left-sided breast cancer.  There is a left neck/supraclavicular fossa mass.  The differential diagnosis includes breast cancer, and other primary tumor sites.  The CT pelvis revealed thickening at the rectum and bladder which makes these possible primary tumor sites.  The differential diagnosis also includes multiple myeloma.  The plan is to proceed with additional  staging and diagnostic evaluation.  Recommendations: 1.   Continue intravenous hydration, follow calcium level 10/04/2018 2.   CT neck and chest 3.   Diagnostic biopsy by interventional radiology if a left neck mass is confirmed 4.   Narcotic analgesics as needed for pain    Betsy Coder, MD 10/03/2018, 7:34 AM

## 2018-10-03 NOTE — Progress Notes (Signed)
Pt.husband keep coming to the nurse station to ask for his wife pain medication.He reported that Tramadol is not helping.The charge nurse followed the pt. To the pt.room immediatly and pt. was sleeping . Pt.slept for good four hours.After pt. Husband woke up and headed to the nurse station and asked for  his wife's pain medication.He stated that the pain is intense.This nurse brought morphine on hand ask if pt.has pain.pt.stated that 'I have no pain and I feel good." This nurse assured pt.husband that pt.is not hurting and will continue to monitor.

## 2018-10-03 NOTE — Progress Notes (Signed)
10/03/18  Started 24hr urine on 10/03/18 at 0830.

## 2018-10-03 NOTE — Progress Notes (Signed)
Pt's safety floor mate  is removed  from the room for the safety of  pt.husband who is elderly and   Confused.

## 2018-10-03 NOTE — Plan of Care (Signed)
Plan of care discussed with Patient and Debbie.  Requesting conversation with PMD for palliative care. Confirms DNR status and agreeable to care as ordered.

## 2018-10-03 NOTE — Plan of Care (Signed)
  Problem: Safety: Goal: Ability to remain free from injury will improve Outcome: Progressing   Problem: Skin Integrity: Goal: Risk for impaired skin integrity will decrease Outcome: Progressing   Problem: Pain Managment: Goal: General experience of comfort will improve Outcome: Progressing   

## 2018-10-03 NOTE — Progress Notes (Signed)
PROGRESS NOTE    [name]  KPT:[id number] DOB: June 24, 1929 DOA: 10/02/2018 PCP: Janie Morning, DO  Brief Narrative:  HPI per Dr. Shela Leff on 10/02/18 [name] is a 84 y.o. female with medical history significant of h/o skin cancer, h/o breast cancer, hypertension, hyperlipidemia presenting to the hospital for evaluation of altered mental status per family.  Daughter at bedside states patient seemed confused earlier today and was not able to pick up a bowl to feed herself.  She did not notice any slurring of speech, facial droop, or focal weakness.  States patient has been complaining of left-sided sacral pain for the past 2 weeks.  She does have a history of chronic back pain due to compression fractures.  Patient has been feeling nauseous but has not vomited.  She denies having any abdominal pain.  States she took oxycodone this morning for her chronic back pain but has taken this medication before.  ED Course: Hemodynamically stable.  White count 12.9.  Hemoglobin 10.5 with normal MCV, no recent baseline.  Corrected calcium 14.9.  Chest x-ray showing mild left pleural effusion with probable underlying atelectasis or infiltrate and minimal right pleural effusion.  CT head negative for acute intracranial process.  CT pelvis showing multiple lytic lesions in the pelvis and sacrum, with the largest seen posteriorly in the right sacrum.  These are thought to be consistent with metastatic disease or possibly multiple myeloma.  CT also showing wall thickening of distal sigmoid colon and rectum with surrounding inflammation most consistent with proctocolitis although neoplasm cannot be excluded.  There is left inguinal and common iliac adenopathy, malignancy cannot be excluded.  There is wall thickening in the superior portion of the urinary bladder, neoplasm cannot be excluded.  Patient received 1 L normal saline bolus in the ED.  [facility] paged to admit.  **Calcium level is improving and  oncology evaluated patient and ordered a CT of the soft tissue of the neck.  Patient made minimal urine during our shift so was given 2 boluses and currently obtaining a renal ultrasound.  Patient does have a history of a nephrectomy.  Creatinine did not increase very much.   Assessment & Plan:   Principal Problem:   Hypercalcemia Active Problems:   Acute metabolic encephalopathy   Anemia   Leukocytosis   Hyponatremia   Abnormal LFTs   Hypertension  Hypercalcemia in the setting of suspected malignancy -Either a recurrence of metastatic breast cancer with multiple myeloma -Rectal calcium on admission was 14.9 -Patient was awake and alert and answer questions appropriately but family states she is very confused and has no history of dementia.  Had no other complaints. -CT abdomen and pelvis showed multiple lytic lesions in the pelvis and sacrum with a large seen posterior to the right sacrum.  These are thought to be consistent with metastatic disease and possibly multiple myeloma.  The CT also showed thickening of the distal sigmoid colon and rectum with surrounding inflammation most consistent with proctocolitis, neoplasm could not be excluded and there is also left inguinal, iliac adenopathy.  Malignancy cannot be excluded as well there is also bladder wall thickening in the superior portion of the urinary bladder which neoplasm could not be excluded. -Recommendations were for a sigmoidoscopy as well as a cystoscopy -I discussed the case with urology Dr. Noah Delaine who recommends obtaining a renal ultrasound currently as the patient has minimal urine output to evaluate for hydronephrosis and if the patient does have hydronephrosis recommends placing Foley  catheter and to call him back -Patient received 1 L normal Cymbalta in the ED and was also given IV zoledronic acid 4 mg for severe hypercalcemia -Creatinine on admission was 0.97 and slightly worsened to 1.04 -Dr. Benay Spice from oncology to  evaluate the patient -Patient was given normal saline at 200 and mils per hour however subsequently fell off more and was reinitiated 200 and mils per hour -Continue monitor strict I's and O's and urine output -Patient given tramadol 50 mg p.o. every 6 as needed for pain as well as acetaminophen -Check ionized calcium level -Continue to monitor and repeat CMP -Patient has had multiple studies sent off including LDH, beta-2 microglobulin, CRP, serum kappa and lambda light chains, she is also had SPEP with immunofixation as well as UPEP with immunofixation -Dr. Benay Spice of oncology evaluated and recommended continue IV fluid hydration with normal saline and repeat a CMP in a.m. -He also recommended CT of the neck and chest and a diagnostic biopsy by interventional radiology if a neck mass was confirmed -CT of the chest showed enlarged left subpectoral lymph node and there is also prominent left axillary enlarged right axillary adenopathy consistent with metastatic disease.  There is also lytic lesions involving the thoracic spine and ribs concerning for osseous metastatic disease and what appeared to be a pathological T4 vertebral fracture.  There is also moderate left and a small right pleural effusion noted on CT scan and there was underlying consolidation with adjacent pulmonary parenchyma which may represent atelectasis or infection. -May have IR biopsy nodes -Patient's WBC is trending down and she is afebrile -CRP was 3.5 and procalcitonin level was less than 0.10 making infection less likely and more likely atelectasis  Minimal urine output/poor urine output -Continue with normal saline at rate of 100 and mils per hour -Given 2 500 mL boluses -Obtain renal ultrasound -Place foley Catheter and discuss with Urology if shows Hydronephrosis   Acute metabolic encephalopathy -Likely related to severe hypercalcemia.   -Patient uses oxycodone at home for chronic back pain which could also possibly  be contributing. - At present, she is awake, alert, and oriented to person place.   -Answering questions appropriately.  No focal neuro deficits.  -CT head negative for acute intracranial process.   -Management of hypercalcemia as above -Oncology Consulted   Normocytic Anemia -Hemoglobin 10.5 with normal MCV, no recent baseline.   -In the setting of suspected multiple myeloma/malignancy. -Hb/Hct went from 10.5/34.0 -> 10.1/32.0 -Check Anemia Panel in the AM  -Continue to Monitor for S/Sx of Bleeding -Repeat CMP in AM   Mild Leukocytosis -White count 12.9 trended down to 11.4 -Chest x-ray showing mild left pleural effusion with probable underlying atelectasis or infiltrate and minimal right pleural effusion.    However CT scan showed a moderate layering pleural effusion -CT with evidence of possible proctocolitis. Patient is not complaining of any abdominal pain.  Abdominal exam benign.   -UA not indicative of infection and urine cultures pending -Pneumonia less likely as patient is afebrile and has no respiratory complaints.  Satting well on room air. Appears nontoxic on exam. -Repeat CBC in a.m. -Check procalcitonin level and was less than 0.0 -Likely in the setting of malignancy rather than infection  Mild Hyponatremia -Sodium 132. -Continue fluid resuscitation for severe hypercalcemia -Repeat labs in the morning  Abnormal LFTs -AST 69, remainder of LFTs normal and trended down to 65 -No recent baseline in the chart.  Patient is not complaining of any abdominal pain.   -  She is nauseous, which could be explained by severe hypercalcemia.  No episodes of vomiting. -May need an Abdominal U/S -Repeat CMP in AM  Hypertension -Hold home diuretic/ARB receiving fluid hydrated -IV hydralazine 5 mg every 4 hours as needed for SBP>160  Left Pleural Effusion -Will get IR to do a Thoracentesis in the AM  Multiple Thyroid Nodules -? Benign Goiter -Consider Thyroid U/S if lymph  nodes not biopsied -Check TSH and Free T4  Pathologic T4 Fracture -C/w Pain Control with Hydrocodone-Acetaminophen  DVT prophylaxis: Enoxaparin sq 24h Code Status: DO NOT RESUSCITATE Family Communication: Discussed with Husband, Daughter, and Grand-daughters at bedside Disposition Plan: Remain Inpatient for current   Consultants:   Medical Oncology   Procedures: None   Antimicrobials:  Anti-infectives (From admission, onward)   None     Subjective: Seen and examined at bedside and was slightly confused but able to answer questions appropriately.  No chest pain, lightheadedness or dizziness.  Denies any pain currently.  No nausea or vomiting.  Is at all of the patient's and family members questions to their satisfaction.  Objective: Vitals:   10/03/18 0551 10/03/18 1323 10/03/18 1436 10/03/18 1509  BP: (!) 136/56 (!) 191/68  (!) 153/53  Pulse: 81 82  81  Resp: 16 (!) 22    Temp: 97.6 F (36.4 C) 97.9 F (36.6 C)    TempSrc: Oral     SpO2: 96% 93% 93%   Weight: 53.5 kg     Height:        Intake/Output Summary (Last 24 hours) at 10/03/2018 1910 Last data filed at 10/03/2018 1400 Gross per 24 hour  Intake 678.67 ml  Output -  Net 678.67 ml   Filed Weights   10/02/18 1411 10/03/18 0551  Weight: 53.5 kg 53.5 kg   Examination: Physical Exam:  Constitutional: WN/WD thin Frail ill appearing Caucasian female in NAD and appears calm but slightly uncomfortable Eyes: Lids and conjunctivae normal, sclerae anicteric  ENMT: External Ears, Nose appear normal. Grossly normal hearing. Slightly dry mucous Membranes; Appreciable mass palpated in the Lower Left neck Neck: Appears normal, supple, no cervical masses, normal ROM, no appreciable thyromegaly; no JVD Respiratory: Diminished to auscultation bilaterally, no wheezing, rales, rhonchi or crackles. Normal respiratory effort and patient is not tachypenic. No accessory muscle use.  Cardiovascular: RRR, no murmurs / rubs /  gallops. S1 and S2 auscultated. No LE extremity edema.  Abdomen: Soft, non-tender, non-distended. No masses palpated. No appreciable hepatosplenomegaly. Bowel sounds positive x4.  GU: Deferred. Musculoskeletal: No clubbing / cyanosis of digits/nails. No joint deformities  Skin: No rashes, lesions, ulcers on a limited skin eval. No induration; Warm and dry.  Neurologic: CN 2-12 grossly intact with no focal deficits Romberg sign and cerebellar reflexes not assessed.  Psychiatric: Normal judgment and insight. Normal mood and appropriate affect. Awake and alert x2  Data Reviewed: I have personally reviewed following labs and imaging studies  CBC: Recent Labs  Lab 10/02/18 1529 10/03/18 0542  WBC 12.9* 11.4*  NEUTROABS 9.9*  --   HGB 10.5* 10.1*  HCT 34.0* 32.0*  MCV 84.8 84.4  PLT 323 818   Basic Metabolic Panel: Recent Labs  Lab 10/02/18 1529 10/03/18 0542 10/03/18 1606  NA 132* 132* 132*  K 3.6 3.5 3.5  CL 91* 96* 98  CO2 '31 27 26  '$ GLUCOSE 115* 108* 96  BUN 27* 25* 27*  CREATININE 0.97 0.97 1.04*  CALCIUM 14.3* 13.3* 12.6*   GFR: Estimated Creatinine Clearance: 31.5 mL/min (  A) (by C-G formula based on SCr of 1.04 mg/dL (H)). Liver Function Tests: Recent Labs  Lab 10/02/18 1529 10/03/18 0542  AST 69* 64*  ALT 24 24  ALKPHOS 124 104  BILITOT 0.8 0.6  PROT 7.0 5.9*  ALBUMIN 3.2* 2.6*   No results for input(s): LIPASE, AMYLASE in the last 168 hours. No results for input(s): AMMONIA in the last 168 hours. Coagulation Profile: No results for input(s): INR, PROTIME in the last 168 hours. Cardiac Enzymes: No results for input(s): CKTOTAL, CKMB, CKMBINDEX, TROPONINI in the last 168 hours. BNP (last 3 results) No results for input(s): PROBNP in the last 8760 hours. HbA1C: No results for input(s): HGBA1C in the last 72 hours. CBG: No results for input(s): GLUCAP in the last 168 hours. Lipid Profile: No results for input(s): CHOL, HDL, LDLCALC, TRIG, CHOLHDL,  LDLDIRECT in the last 72 hours. Thyroid Function Tests: No results for input(s): TSH, T4TOTAL, FREET4, T3FREE, THYROIDAB in the last 72 hours. Anemia Panel: No results for input(s): VITAMINB12, FOLATE, FERRITIN, TIBC, IRON, RETICCTPCT in the last 72 hours. Sepsis Labs: Recent Labs  Lab 10/02/18 2231  PROCALCITON <0.10    No results found for this or any previous visit (from the past 240 hour(s)).   Radiology Studies: Dg Chest 2 View  Result Date: 10/02/2018 CLINICAL DATA:  Chest pain. EXAM: CHEST - 2 VIEW COMPARISON:  Radiographs of September 07, 2018. FINDINGS: Stable cardiomediastinal silhouette. No pneumothorax is noted. Mild left pleural effusion is noted with probable underlying atelectasis or infiltrate. Minimal right pleural effusion is noted. Bony thorax is unremarkable. IMPRESSION: Mild left pleural effusion is noted with probable underlying atelectasis or infiltrate. Minimal right pleural effusion. Electronically Signed   By: Marijo Conception, M.D.   On: 10/02/2018 16:22   Ct Head Wo Contrast  Result Date: 10/02/2018 CLINICAL DATA:  Patient with chest pain.  Nausea. EXAM: CT HEAD WITHOUT CONTRAST TECHNIQUE: Contiguous axial images were obtained from the base of the skull through the vertex without intravenous contrast. COMPARISON:  None. FINDINGS: Brain: Ventricles and sulci are appropriate for patient's age. No evidence for acute cortically based infarct, intracranial hemorrhage, mass lesion or mass-effect. Vascular: Unremarkable Skull: Intact. Sinuses/Orbits: Paranasal sinuses well aerated. Mastoid air cells unremarkable. Orbits unremarkable. Other: None. IMPRESSION: No acute intracranial process. Electronically Signed   By: Lovey Newcomer M.D.   On: 10/02/2018 16:59   Ct Soft Tissue Neck Wo Contrast  Result Date: 10/03/2018 CLINICAL DATA:  Metastatic cancer. EXAM: CT NECK WITHOUT CONTRAST TECHNIQUE: Multidetector CT imaging of the neck was performed following the standard protocol  without intravenous contrast. COMPARISON:  None. FINDINGS: Pharynx and larynx: No mucosal or submucosal lesion. Salivary glands: Parotid and submandibular glands are normal. Thyroid: Multiple thyroid nodules. This could be benign goiter. Consider thyroid ultrasound for more accurate evaluation. Lymph nodes: No enlarged or low-density nodes in the neck region. There slightly prominent nodes in the supraclavicular fat on both sides. These are nonspecific. They could represent reactive nodes or malignancy. Vascular: Ordinary atherosclerotic calcification of the carotid bifurcation regions. Limited intracranial: Negative Visualized orbits: Normal Mastoids and visualized paranasal sinuses: Clear Skeleton: Lytic foci scattered throughout the cervical and thoracic spine consistent with osseous metastatic disease. No evidence of pathologic fracture in the cervical region. Possible pathologic fracture at T4. No discernible tumor involvement of the canal, but CT is not sensitive. Upper chest: See results of chest CT Other: None IMPRESSION: Lytic metastatic lesions affecting the cervical and upper thoracic spine. No evidence of  pathologic fracture in the cervical region. Probable pathologic fracture of T4. See results of chest CT. Slightly prominent supraclavicular lymph nodes on each side. No abnormal nodes higher in the neck. Multiple thyroid nodules. Most likely diagnosis is benign goiter, but thyroid ultrasound could be performed for more accurate evaluation if primary carcinoma is not identified elsewhere. In the absence of regional lymph nodes, it would be unusual for this to be thyroid carcinoma. Electronically Signed   By: Nelson Chimes M.D.   On: 10/03/2018 10:55   Ct Chest Wo Contrast  Result Date: 10/03/2018 CLINICAL DATA:  Patient with altered mental status. Lytic lesions within the pelvis. Effusion. EXAM: CT CHEST WITHOUT CONTRAST TECHNIQUE: Multidetector CT imaging of the chest was performed following the  standard protocol without IV contrast. COMPARISON:  CT pelvis 10/02/2018; chest radiograph 10/02/2018 FINDINGS: Cardiovascular: Heart is enlarged. Trace pericardial effusion. Thoracic aortic vascular calcification. Mediastinum/Nodes: Prominent 9 mm left axillary lymph node (image 46; series 2). 2.6 x 3.5 cm left subpectoral lymph node. 1.2 cm right axillary lymph node (image 41; series 2). 1.0 cm node superior mediastinum (image 40; series 2). Normal appearance of the esophagus. Heterogeneous thyroid. Lungs/Pleura: Central airways are patent. Moderate left and small right pleural effusions. Subpleural consolidation within the left and right lower lobes. No pneumothorax. Upper Abdomen: Small volume perihepatic ascites. Musculoskeletal: Posterior left ninth, tenth and eleventh healing rib fractures. Healing posterior right sixth rib fracture. Multiple healing lateral right rib fractures involving the third and seventh ribs. Mixed lucent and sclerotic lesions involving the T1, T2, T4 and T12 vertebral bodies. There is a mild pathologic fracture of the T4 vertebral body with associated height loss. Prior left mastectomy. Small lucent lesions involving the ribs bilaterally. IMPRESSION: 1. Large left subpectoral lymph node. Additionally there is prominent left axillary and enlarged right axillary adenopathy. Findings are concerning for metastatic disease. 2. Lytic lesions involving the thoracic spine and ribs concerning for osseous metastatic disease. There is mild height loss of the T4 vertebral body most compatible with pathologic fracture. 3. Moderate left and small right layering pleural effusions. There is underlying consolidation within the adjacent pulmonary parenchyma which may represent atelectasis or infection. 4. Aortic Atherosclerosis (ICD10-I70.0). Electronically Signed   By: Lovey Newcomer M.D.   On: 10/03/2018 12:54   Ct Pelvis Wo Contrast  Result Date: 10/02/2018 CLINICAL DATA:  Left sacroiliac joint  pain. EXAM: CT PELVIS WITHOUT CONTRAST TECHNIQUE: Multidetector CT imaging of the pelvis was performed following the standard protocol without intravenous contrast. COMPARISON:  CT scan of February 17, 2014. FINDINGS: Urinary Tract: Wall thickening is seen involving superior portion of urinary bladder. Bowel: Sigmoid diverticulosis is noted. There is no evidence of bowel obstruction. Wall thickening of distal sigmoid colon and rectum is noted with surrounding inflammation consistent with proctocolitis or possibly neoplasm. Vascular/Lymphatic: Atherosclerosis of abdominal aorta is noted. Left inguinal lymph node measuring 1.1 cm is noted. 9 mm left common iliac lymph node is noted. Reproductive: No mass or other significant abnormality. Status post hysterectomy. Other:  No hernia or abnormal fluid collection is noted. Musculoskeletal: Multiple lytic lesions are seen in the pelvis and sacrum, consistent with metastatic disease. Largest lesion is seen posteriorly in right sacrum. IMPRESSION: Multiple lytic lesions are noted in the pelvis and sacrum, with the largest seen posteriorly in right sacrum. These are consistent with metastatic disease or possibly multiple myeloma. Wall thickening of distal sigmoid colon and rectum is noted with surrounding inflammation most consistent with proctocolitis, although neoplasm cannot be  excluded. Sigmoidoscopy is recommended for further evaluation. Left inguinal and common iliac adenopathy is noted which may be inflammatory in etiology, but malignancy cannot be excluded. Wall thickening is seen involving the superior portion of urinary bladder; neoplasm cannot be excluded and cystoscopy is recommended. Electronically Signed   By: Marijo Conception, M.D.   On: 10/02/2018 17:06   US Renal  Result Date: 10/03/2018 CLINICAL DATA:  Decreased urine output. History of RIGHT nephrectomy. EXAM: RENAL / URINARY TRACT ULTRASOUND COMPLETE COMPARISON:  CT pelvis dated 10/02/2018. FINDINGS:  Right Kidney: Surgically removed. Left Kidney: Renal measurements: 11.9 x 6.2 x 6.2 cm = volume: 240 mL. Mild to moderate hydronephrosis. LEFT renal cyst measures 2.4 cm. Bladder: Appears normal for degree of bladder distention. LEFT ureteral jet is visualized. IMPRESSION: 1. LEFT renal hydronephrosis, mild to moderate in degree. Suspect some degree of associated obstruction at the level of the bladder wall thickening as described on recent pelvis CT of 10/02/2018. However, a LEFT ureteral jet is visualized at the level of the bladder indicating patency of the LEFT UVJ. 2. Status post RIGHT nephrectomy. Electronically Signed   By: Franki Cabot M.D.   On: 10/03/2018 18:54   Scheduled Meds: . aspirin EC  81 mg Oral Daily  . enoxaparin (LOVENOX) injection  30 mg Subcutaneous QHS  . [START ON 10/04/2018] ipratropium-albuterol  3 mL Nebulization BID  .  morphine injection  0.5 mg Intravenous Once   Continuous Infusions: . sodium chloride Stopped (10/03/18 1528)  . sodium chloride 500 mL (10/03/18 1828)    LOS: 1 day   Kerney Elbe, DO [facility] PAGER is on [place]  If 7PM-7AM, please contact night-coverage [email or web address] Password [email or web address] 10/03/2018, 7:10 PM

## 2018-10-04 ENCOUNTER — Inpatient Hospital Stay (HOSPITAL_COMMUNITY): Payer: Medicare Other

## 2018-10-04 DIAGNOSIS — J810 Acute pulmonary edema: Secondary | ICD-10-CM

## 2018-10-04 DIAGNOSIS — R0602 Shortness of breath: Secondary | ICD-10-CM

## 2018-10-04 LAB — LACTATE DEHYDROGENASE, PLEURAL OR PERITONEAL FLUID: LD, Fluid: 119 U/L — ABNORMAL HIGH (ref 3–23)

## 2018-10-04 LAB — ECHOCARDIOGRAM COMPLETE
Height: 63 in
WEIGHTICAEL: 1887.14 [oz_av]

## 2018-10-04 LAB — CBC WITH DIFFERENTIAL/PLATELET
Abs Immature Granulocytes: 0.51 10*3/uL — ABNORMAL HIGH (ref 0.00–0.07)
BASOS ABS: 0.1 10*3/uL (ref 0.0–0.1)
Basophils Relative: 1 %
EOS ABS: 0.1 10*3/uL (ref 0.0–0.5)
Eosinophils Relative: 1 %
HEMATOCRIT: 31.5 % — AB (ref 36.0–46.0)
Hemoglobin: 9.5 g/dL — ABNORMAL LOW (ref 12.0–15.0)
IMMATURE GRANULOCYTES: 4 %
LYMPHS ABS: 0.8 10*3/uL (ref 0.7–4.0)
LYMPHS PCT: 6 %
MCH: 25.7 pg — ABNORMAL LOW (ref 26.0–34.0)
MCHC: 30.2 g/dL (ref 30.0–36.0)
MCV: 85.1 fL (ref 80.0–100.0)
Monocytes Absolute: 0.9 10*3/uL (ref 0.1–1.0)
Monocytes Relative: 6 %
NRBC: 0 % (ref 0.0–0.2)
Neutro Abs: 11.7 10*3/uL — ABNORMAL HIGH (ref 1.7–7.7)
Neutrophils Relative %: 82 %
Platelets: 300 10*3/uL (ref 150–400)
RBC: 3.7 MIL/uL — AB (ref 3.87–5.11)
RDW: 16 % — AB (ref 11.5–15.5)
WBC: 14 10*3/uL — AB (ref 4.0–10.5)

## 2018-10-04 LAB — GLUCOSE, PLEURAL OR PERITONEAL FLUID: Glucose, Fluid: 85 mg/dL

## 2018-10-04 LAB — ALBUMIN, PLEURAL OR PERITONEAL FLUID: Albumin, Fluid: 1.6 g/dL

## 2018-10-04 LAB — KAPPA/LAMBDA LIGHT CHAINS
Kappa free light chain: 57.5 mg/L — ABNORMAL HIGH (ref 3.3–19.4)
Kappa, lambda light chain ratio: 1.65 (ref 0.26–1.65)
Lambda free light chains: 34.8 mg/L — ABNORMAL HIGH (ref 5.7–26.3)

## 2018-10-04 LAB — MAGNESIUM: MAGNESIUM: 1.7 mg/dL (ref 1.7–2.4)

## 2018-10-04 LAB — COMPREHENSIVE METABOLIC PANEL
ALBUMIN: 2.3 g/dL — AB (ref 3.5–5.0)
ALK PHOS: 93 U/L (ref 38–126)
ALT: 28 U/L (ref 0–44)
AST: 62 U/L — AB (ref 15–41)
Anion gap: 9 (ref 5–15)
BUN: 26 mg/dL — AB (ref 8–23)
CO2: 22 mmol/L (ref 22–32)
Calcium: 11.5 mg/dL — ABNORMAL HIGH (ref 8.9–10.3)
Chloride: 101 mmol/L (ref 98–111)
Creatinine, Ser: 1.13 mg/dL — ABNORMAL HIGH (ref 0.44–1.00)
GFR calc Af Amer: 49 mL/min — ABNORMAL LOW (ref >60)
GFR calc non Af Amer: 42 mL/min — ABNORMAL LOW (ref >60)
GLUCOSE: 85 mg/dL (ref 70–99)
POTASSIUM: 3.8 mmol/L (ref 3.5–5.1)
SODIUM: 132 mmol/L — AB (ref 135–145)
Total Bilirubin: 0.5 mg/dL (ref 0.3–1.2)
Total Protein: 5.3 g/dL — ABNORMAL LOW (ref 6.5–8.1)

## 2018-10-04 LAB — AMYLASE, PLEURAL OR PERITONEAL FLUID: AMYLASE FL: 61 U/L

## 2018-10-04 LAB — PROTEIN, PLEURAL OR PERITONEAL FLUID

## 2018-10-04 LAB — URINE CULTURE: Culture: NO GROWTH

## 2018-10-04 LAB — GRAM STAIN

## 2018-10-04 LAB — BODY FLUID CELL COUNT WITH DIFFERENTIAL
Eos, Fluid: 4 %
LYMPHS FL: 10 %
Monocyte-Macrophage-Serous Fluid: 78 % (ref 50–90)
NEUTROPHIL FLUID: 8 % (ref 0–25)
WBC FLUID: 2804 uL — AB (ref 0–1000)

## 2018-10-04 LAB — BETA 2 MICROGLOBULIN, SERUM: Beta-2 Microglobulin: 3.9 mg/L — ABNORMAL HIGH (ref 0.6–2.4)

## 2018-10-04 LAB — PHOSPHORUS: PHOSPHORUS: 3.1 mg/dL (ref 2.5–4.6)

## 2018-10-04 LAB — BRAIN NATRIURETIC PEPTIDE: B NATRIURETIC PEPTIDE 5: 243.6 pg/mL — AB (ref 0.0–100.0)

## 2018-10-04 MED ORDER — FUROSEMIDE 10 MG/ML IJ SOLN
40.0000 mg | Freq: Once | INTRAMUSCULAR | Status: AC
  Administered 2018-10-04: 40 mg via INTRAVENOUS
  Filled 2018-10-04: qty 4

## 2018-10-04 MED ORDER — ALBUTEROL SULFATE (2.5 MG/3ML) 0.083% IN NEBU
INHALATION_SOLUTION | RESPIRATORY_TRACT | Status: AC
  Filled 2018-10-04: qty 3

## 2018-10-04 MED ORDER — POLYETHYLENE GLYCOL 3350 17 G PO PACK: 17.0000 g | PACK | Freq: Every day | ORAL | Status: DC | PRN

## 2018-10-04 MED ORDER — HYDROCODONE-ACETAMINOPHEN 5-325 MG PO TABS
1.0000 | ORAL_TABLET | Freq: Four times a day (QID) | ORAL | Status: DC | PRN
  Administered 2018-10-04 – 2018-10-06 (×6): 1 via ORAL
  Filled 2018-10-04 (×6): qty 1

## 2018-10-04 MED ORDER — ENOXAPARIN SODIUM 30 MG/0.3ML ~~LOC~~ SOLN
30.0000 mg | Freq: Every day | SUBCUTANEOUS | Status: DC
  Administered 2018-10-05 – 2018-10-08 (×4): 30 mg via SUBCUTANEOUS
  Filled 2018-10-04 (×4): qty 0.3

## 2018-10-04 MED ORDER — LIDOCAINE HCL 1 % IJ SOLN
INTRAMUSCULAR | Status: AC
  Filled 2018-10-04: qty 10

## 2018-10-04 NOTE — Progress Notes (Signed)
PROGRESS NOTE    [name]  UXN:[id number] DOB: 22-Aug-1929 DOA: 10/02/2018 PCP: Janie Morning, DO  Brief Narrative:  HPI per Dr. Shela Leff on 10/02/18 [name] is a 84 y.o. female with medical history significant of h/o skin cancer, h/o breast cancer, hypertension, hyperlipidemia presenting to the hospital for evaluation of altered mental status per family.  Daughter at bedside states patient seemed confused earlier today and was not able to pick up a bowl to feed herself.  She did not notice any slurring of speech, facial droop, or focal weakness.  States patient has been complaining of left-sided sacral pain for the past 2 weeks.  She does have a history of chronic back pain due to compression fractures.  Patient has been feeling nauseous but has not vomited.  She denies having any abdominal pain.  States she took oxycodone this morning for her chronic back pain but has taken this medication before.  ED Course: Hemodynamically stable.  White count 12.9.  Hemoglobin 10.5 with normal MCV, no recent baseline.  Corrected calcium 14.9.  Chest x-ray showing mild left pleural effusion with probable underlying atelectasis or infiltrate and minimal right pleural effusion.  CT head negative for acute intracranial process.  CT pelvis showing multiple lytic lesions in the pelvis and sacrum, with the largest seen posteriorly in the right sacrum.  These are thought to be consistent with metastatic disease or possibly multiple myeloma.  CT also showing wall thickening of distal sigmoid colon and rectum with surrounding inflammation most consistent with proctocolitis although neoplasm cannot be excluded.  There is left inguinal and common iliac adenopathy, malignancy cannot be excluded.  There is wall thickening in the superior portion of the urinary bladder, neoplasm cannot be excluded.  Patient received 1 L normal saline bolus in the ED.  TR[facility] paged to admit.  **Calcium level is improving and  oncology evaluated patient and ordered a CT of the soft tissue of the neck.  Patient made minimal urine during yesterday;s shift so was given 2 boluses and currently obtaining a renal ultrasound.  Renal ultrasound showed left hydronephrosis so Foley catheter was placed and urology was consulted.  Urology recommends continuing Foley catheter drainage and repeating a renal ultrasound.  Palliative care was called for goals of care.  Overnight the patient became a little bit dyspneic so IV fluids were stopped and a chest x-ray revealed that she had some volume overload and pulmonary edema.  We will obtain an echocardiogram and BNP was slightly elevated, so patient may have some evidence of heart failure.  IV fluids were discontinued and she was given a dose of IV Lasix.  Patient is to undergo a thoracentesis today as well as a CT-guided biopsy of her Lymph Nodes.  Assessment & Plan:   Principal Problem:   Hypercalcemia Active Problems:   Acute metabolic encephalopathy   Anemia   Leukocytosis   Hyponatremia   Abnormal LFTs   Hypertension  Hypercalcemia in the setting of suspected malignancy, improving  -Either a recurrence of metastatic breast cancer with multiple myeloma -Corrected calcium on admission was 14.9 -Patient was awake and alert and answer questions appropriately but family states she is very confused and has no history of dementia.  Had no other complaints. -CT abdomen and pelvis showed multiple lytic lesions in the pelvis and sacrum with a large seen posterior to the right sacrum.  These are thought to be consistent with metastatic disease and possibly multiple myeloma.  The CT also showed  thickening of the distal sigmoid colon and rectum with surrounding inflammation most consistent with proctocolitis, neoplasm could not be excluded and there is also left inguinal, iliac adenopathy.  Malignancy cannot be excluded as well there is also bladder wall thickening in the superior portion of  the urinary bladder which neoplasm could not be excluded. -Recommendations were for a sigmoidoscopy as well as a cystoscopy -I discussed the case with urology Dr. Noah Delaine who recommended obtaining a renal ultrasound currently as the patient has minimal urine output to evaluate for hydronephrosis and if the patient does have hydronephrosis recommends placing Foley catheter and to call him back; Foley catheter palced and Urology Dr. Luberta Robertson consulted for formal consultation  -Patient received 1 L normal Saline in the ED and was also given IV zoledronic acid 4 mg for severe hypercalcemia -Creatinine on admission was 0.97 and slightly worsened to 1.04 -> 1.13 -Dr. Benay Spice from oncology to evaluate the patient -Patient was given normal saline at 200 and mils per hour however subsequently fell off more and was reinitiated 200 and mils per hour -Continue monitor strict I's and O's and urine output -Patient given tramadol 50 mg p.o. every 6 as needed for pain as well as acetaminophen -Check ionized calcium level -Continue to monitor and repeat CMP -Ca2+ trending down and was 11.5 this AM  -Patient has had multiple studies sent off including LDH, beta-2 microglobulin, CRP, serum kappa and lambda light chains (kappa free light chains 57.5 and lambda free light chain of 34.8, kappa lambda light chain ratio 1.65), she is also had SPEP with immunofixation as well as UPEP with immunofixation -Dr. Benay Spice of oncology evaluated and recommended continue IV fluid hydration with normal saline and repeat a CMP in a.m. -He also recommended CT of the neck and chest and a diagnostic biopsy by interventional radiology if a neck mass was confirmed -CT of the chest showed enlarged left subpectoral lymph node and there is also prominent left axillary enlarged right axillary adenopathy consistent with metastatic disease.  There is also lytic lesions involving the thoracic spine and ribs concerning for osseous metastatic  disease and what appeared to be a pathological T4 vertebral fracture.  There is also moderate left and a small right pleural effusion noted on CT scan and there was underlying consolidation with adjacent pulmonary parenchyma which may represent atelectasis or infection. -Will have IR biopsy node for Diagnosis this will be done 10/05/2018 -CRP was 3.5 and procalcitonin level was less than 0.10 making infection less likely and more likely atelectasis -Palliative Care Consulted for GOC Discussion  Minimal urine output/poor urine output in the setting of Left Hydronephrosis from ?Bladder Obstruction -CT Scan of pelvis showed wall thickening involving the superior portion of the urinary bladder and could not exclude a neoplasm and recommended cystoscopy -Continue with normal saline at rate of 100 and mils per hour -Given two 500 mL boluses -Obtain renal ultrasound which showed left-sided hydronephrosis with mild to moderate in degree and suspected some degree of associated obstruction at the level of the bladder wall thickening.  Patient had a left ureteral jet was visualized at the level of the bladder indicating patency of the left UVJ there is status post right nephrectomy -Placed a Foley catheter and discussed with Dr. Diona Fanti of Urology.  She has other underlying pressing medical issues Dr. Diona Fanti does not necessarily think she needs a cystoscopy at this time recommend leaving the Foley catheter and doing a voiding trial when she is up and more amatory.  He is ordering an ultrasound for follow-up of her left hydronephrosis and that would hope to improve her adequate bladder drainage  Acute metabolic encephalopathy -Likely related to severe hypercalcemia.   -Patient uses oxycodone at home for chronic back pain which could also possibly be contributing. - At present, she is awake, alert, and oriented to person place.   -Answering questions appropriately.  No focal neuro deficits.  -CT head  negative for acute intracranial process.   -Management of hypercalcemia as above -Oncology Consulted   Normocytic Anemia -Hemoglobin 10.5 with normal MCV, no recent baseline.   -In the setting of suspected multiple myeloma/malignancy. -Hb/Hct went from 10.5/34.0 -> 10.1/32.0 -> 9.5/31.5 -Check Anemia Panel in the AM  -Continue to Monitor for S/Sx of Bleeding -Repeat CMP in AM   Mild Leukocytosis -White count 12.9 trended down to 11.4 but now back up to 14.0 -? Leukocytosis related to Pain -Chest x-ray showing mild left pleural effusion with probable underlying atelectasis or infiltrate and minimal right pleural effusion.    However CT scan showed a moderate layering pleural effusion -CT with evidence of possible proctocolitis. Patient is not complaining of any abdominal pain.  Abdominal exam benign.   -UA not indicative of infection and urine cultures pending -Pneumonia less likely as patient is afebrile and has no respiratory complaints.  Satting well on room air. Appears nontoxic on exam. -Repeat CBC in a.m. -Check procalcitonin level and was less than 0.0 -Likely in the setting of malignancy rather than infection  Mild Hyponatremia -Sodium 132. -Continuef fluid resuscitation for severe hypercalcemia but D/C'd due to Pulmonary edema and -Repeat labs in the morning  Abnormal LFTs -AST 69, remainder of LFTs normal and trended down to 62 -No recent baseline in the chart.  Patient is not complaining of any abdominal pain.   -She is nauseous, which could be explained by severe hypercalcemia.  No episodes of vomiting. -May need an Abdominal U/S -Repeat CMP in AM  Hypertension -Hold home diuretic/ARB receiving IVF hydration but now stopped  -IV hydralazine 5 mg every 4 hours as needed for SBP>160  Left Pleural Effusion -X-ray this morning showed increasing size of left-sided pleural effusion with left base consolidation and may represent atelectasis or infiltrate -Will get IR  to do a Thoracentesis  -Had it done today and yielded 930 mL of Slightly Hazy Yellow Fluid -Labs sent for Analysis  -Repeat  Multiple Thyroid Nodules -? Benign Goiter -Consider Thyroid U/S if lymph nodes not biopsied -Check TSH and Free T4  Pathologic T4 Fracture -C/w Pain Control with Hydrocodone-Acetaminophen  Acute Pulmonary Edema -? Cardiac Etiology as there is progressive pulmonary vascular congestion pulmonary edema with cardiomegaly -Stop IVF Hydration and give a dose of Lasix -Strict I's and O's and daily weights -Check BNP and was 243.6 -Patient is +1.299 Liters since admission   DVT prophylaxis: Enoxaparin sq 24h Code Status: DO NOT RESUSCITATE Family Communication: Discussed with Husband and Grand-Daughter at bedside  Disposition Plan: Remain Inpatient for current   Consultants:   Medical Oncology  Interventional radiology  Palliative care medicine  Urology   Procedures: None   Antimicrobials:  Anti-infectives (From admission, onward)   None     Subjective: Seen and examined and that she felt a little dyspneic.  No chest pain, lightheadedness or dizziness but her main complaint was back pain.  No nausea or vomiting.  No other concerns or complaints at this time and granddaughter states that she has been complaining of pain for some time.Marland Kitchen  Objective: Vitals:   10/04/18 1205 10/04/18 1210 10/04/18 1215 10/04/18 1447  BP: 135/61 (!) 129/50 (!) 123/49 (!) 131/44  Pulse:    90  Resp:    14  Temp:    (!) 97.5 F (36.4 C)  TempSrc:    Oral  SpO2:    93%  Weight:      Height:        Intake/Output Summary (Last 24 hours) at 10/04/2018 1555 Last data filed at 10/04/2018 1440 Gross per 24 hour  Intake 2245.33 ml  Output 1625 ml  Net 620.33 ml   Filed Weights   10/02/18 1411 10/03/18 0551  Weight: 53.5 kg 53.5 kg   Examination: Physical Exam:  Constitutional: Well-nourished, well-developed in no, ill-appearing Caucasian female currently no  acute distress appears calm but is still appearing uncomfortable Eyes: Lids and conjunctive are normal. ENMT: External ears and nose appear normal.  Grossly normal hearing.  Improved mucous membranes. Neck: Appears supple with no JVD.  Has a appreciable mass in the lower left neck Respiratory: Diminished to auscultation bilaterally with some crackles and mild coarse breath sounds.  Has a normal respiratory effort is not tachypneic but does complain of some dyspnea and is not on any supplemental oxygen via nasal cannula Cardiovascular: Regular rate and rhythm.  No appreciable murmurs, rubs, gallops.  No lower extremity edema noted Abdomen: Soft, nontender, nondistended.  Bowel sounds present GU: Deferred as a Foley catheter in place Musculoskeletal: No contractures or cyanosis.  No joint was noted Skin: No appreciable rashes or lesions on limited skin evaluation Neurologic: Cranial nerves II through XII grossly intact no appreciable focal deficits Psychiatric: Normal judgment and insight.  Patient is awake and alert and oriented x2.  Appears slightly distressed  Data Reviewed: I have personally reviewed following labs and imaging studies  CBC: Recent Labs  Lab 10/02/18 1529 10/03/18 0542 10/04/18 0549  WBC 12.9* 11.4* 14.0*  NEUTROABS 9.9*  --  11.7*  HGB 10.5* 10.1* 9.5*  HCT 34.0* 32.0* 31.5*  MCV 84.8 84.4 85.1  PLT 323 320 673   Basic Metabolic Panel: Recent Labs  Lab 10/02/18 1529 10/03/18 0542 10/03/18 1606 10/04/18 0549  NA 132* 132* 132* 132*  K 3.6 3.5 3.5 3.8  CL 91* 96* 98 101  CO2 '31 27 26 22  '$ GLUCOSE 115* 108* 96 85  BUN 27* 25* 27* 26*  CREATININE 0.97 0.97 1.04* 1.13*  CALCIUM 14.3* 13.3* 12.6* 11.5*  MG  --   --   --  1.7  PHOS  --   --   --  3.1   GFR: Estimated Creatinine Clearance: 29 mL/min (A) (by C-G formula based on SCr of 1.13 mg/dL (H)). Liver Function Tests: Recent Labs  Lab 10/02/18 1529 10/03/18 0542 10/04/18 0549  AST 69* 64* 62*  ALT  '24 24 28  '$ ALKPHOS 124 104 93  BILITOT 0.8 0.6 0.5  PROT 7.0 5.9* 5.3*  ALBUMIN 3.2* 2.6* 2.3*   No results for input(s): LIPASE, AMYLASE in the last 168 hours. No results for input(s): AMMONIA in the last 168 hours. Coagulation Profile: No results for input(s): INR, PROTIME in the last 168 hours. Cardiac Enzymes: No results for input(s): CKTOTAL, CKMB, CKMBINDEX, TROPONINI in the last 168 hours. BNP (last 3 results) No results for input(s): PROBNP in the last 8760 hours. HbA1C: No results for input(s): HGBA1C in the last 72 hours. CBG: No results for input(s): GLUCAP in the last 168 hours. Lipid Profile: No results for  input(s): CHOL, HDL, LDLCALC, TRIG, CHOLHDL, LDLDIRECT in the last 72 hours. Thyroid Function Tests: No results for input(s): TSH, T4TOTAL, FREET4, T3FREE, THYROIDAB in the last 72 hours. Anemia Panel: No results for input(s): VITAMINB12, FOLATE, FERRITIN, TIBC, IRON, RETICCTPCT in the last 72 hours. Sepsis Labs: Recent Labs  Lab 10/02/18 2231  PROCALCITON <0.10    Recent Results (from the past 240 hour(s))  Urine Culture     Status: None   Collection Time: 10/02/18  7:20 PM  Result Value Ref Range Status   Specimen Description   Final    URINE, CLEAN CATCH Performed at [facility], [place] [address], [place], [place] [address]    Special Requests   Final    NONE Performed at [facility], [place] [address] [place], [place] [address]    Culture   Final    NO GROWTH Performed at [facility], [place] [address] [place], [place] [address]    Report Status 10/04/2018 FINAL  Final     Radiology Studies: Dg Chest 1 View  Result Date: 10/04/2018 CLINICAL DATA:  Status post left thoracentesis. EXAM: CHEST  1 VIEW COMPARISON:  Radiograph of October 04, 2018. FINDINGS: Stable cardiomediastinal silhouette. No pneumothorax is noted. Left pleural effusion noted on prior exam is significantly decreased. Mild right basilar  atelectasis and effusion is noted. Residual left basilar subsegmental atelectasis is noted. Bony thorax is unremarkable. IMPRESSION: Significantly decreased left pleural effusion status post thoracentesis. No pneumothorax is noted. Electronically Signed   By: Marijo Conception, M.D.   On: 10/04/2018 12:40   Dg Chest 2 View  Result Date: 10/02/2018 CLINICAL DATA:  Chest pain. EXAM: CHEST - 2 VIEW COMPARISON:  Radiographs of September 07, 2018. FINDINGS: Stable cardiomediastinal silhouette. No pneumothorax is noted. Mild left pleural effusion is noted with probable underlying atelectasis or infiltrate. Minimal right pleural effusion is noted. Bony thorax is unremarkable. IMPRESSION: Mild left pleural effusion is noted with probable underlying atelectasis or infiltrate. Minimal right pleural effusion. Electronically Signed   By: Marijo Conception, M.D.   On: 10/02/2018 16:22   Ct Head Wo Contrast  Result Date: 10/02/2018 CLINICAL DATA:  Patient with chest pain.  Nausea. EXAM: CT HEAD WITHOUT CONTRAST TECHNIQUE: Contiguous axial images were obtained from the base of the skull through the vertex without intravenous contrast. COMPARISON:  None. FINDINGS: Brain: Ventricles and sulci are appropriate for patient's age. No evidence for acute cortically based infarct, intracranial hemorrhage, mass lesion or mass-effect. Vascular: Unremarkable Skull: Intact. Sinuses/Orbits: Paranasal sinuses well aerated. Mastoid air cells unremarkable. Orbits unremarkable. Other: None. IMPRESSION: No acute intracranial process. Electronically Signed   By: Lovey Newcomer M.D.   On: 10/02/2018 16:59   Ct Soft Tissue Neck Wo Contrast  Result Date: 10/03/2018 CLINICAL DATA:  Metastatic cancer. EXAM: CT NECK WITHOUT CONTRAST TECHNIQUE: Multidetector CT imaging of the neck was performed following the standard protocol without intravenous contrast. COMPARISON:  None. FINDINGS: Pharynx and larynx: No mucosal or submucosal lesion. Salivary glands:  Parotid and submandibular glands are normal. Thyroid: Multiple thyroid nodules. This could be benign goiter. Consider thyroid ultrasound for more accurate evaluation. Lymph nodes: No enlarged or low-density nodes in the neck region. There slightly prominent nodes in the supraclavicular fat on both sides. These are nonspecific. They could represent reactive nodes or malignancy. Vascular: Ordinary atherosclerotic calcification of the carotid bifurcation regions. Limited intracranial: Negative Visualized orbits: Normal Mastoids and visualized paranasal sinuses: Clear Skeleton: Lytic foci scattered throughout  the cervical and thoracic spine consistent with osseous metastatic disease. No evidence of pathologic fracture in the cervical region. Possible pathologic fracture at T4. No discernible tumor involvement of the canal, but CT is not sensitive. Upper chest: See results of chest CT Other: None IMPRESSION: Lytic metastatic lesions affecting the cervical and upper thoracic spine. No evidence of pathologic fracture in the cervical region. Probable pathologic fracture of T4. See results of chest CT. Slightly prominent supraclavicular lymph nodes on each side. No abnormal nodes higher in the neck. Multiple thyroid nodules. Most likely diagnosis is benign goiter, but thyroid ultrasound could be performed for more accurate evaluation if primary carcinoma is not identified elsewhere. In the absence of regional lymph nodes, it would be unusual for this to be thyroid carcinoma. Electronically Signed   By: Nelson Chimes M.D.   On: 10/03/2018 10:55   Ct Chest Wo Contrast  Result Date: 10/03/2018 CLINICAL DATA:  Patient with altered mental status. Lytic lesions within the pelvis. Effusion. EXAM: CT CHEST WITHOUT CONTRAST TECHNIQUE: Multidetector CT imaging of the chest was performed following the standard protocol without IV contrast. COMPARISON:  CT pelvis 10/02/2018; chest radiograph 10/02/2018 FINDINGS: Cardiovascular:  Heart is enlarged. Trace pericardial effusion. Thoracic aortic vascular calcification. Mediastinum/Nodes: Prominent 9 mm left axillary lymph node (image 46; series 2). 2.6 x 3.5 cm left subpectoral lymph node. 1.2 cm right axillary lymph node (image 41; series 2). 1.0 cm node superior mediastinum (image 40; series 2). Normal appearance of the esophagus. Heterogeneous thyroid. Lungs/Pleura: Central airways are patent. Moderate left and small right pleural effusions. Subpleural consolidation within the left and right lower lobes. No pneumothorax. Upper Abdomen: Small volume perihepatic ascites. Musculoskeletal: Posterior left ninth, tenth and eleventh healing rib fractures. Healing posterior right sixth rib fracture. Multiple healing lateral right rib fractures involving the third and seventh ribs. Mixed lucent and sclerotic lesions involving the T1, T2, T4 and T12 vertebral bodies. There is a mild pathologic fracture of the T4 vertebral body with associated height loss. Prior left mastectomy. Small lucent lesions involving the ribs bilaterally. IMPRESSION: 1. Large left subpectoral lymph node. Additionally there is prominent left axillary and enlarged right axillary adenopathy. Findings are concerning for metastatic disease. 2. Lytic lesions involving the thoracic spine and ribs concerning for osseous metastatic disease. There is mild height loss of the T4 vertebral body most compatible with pathologic fracture. 3. Moderate left and small right layering pleural effusions. There is underlying consolidation within the adjacent pulmonary parenchyma which may represent atelectasis or infection. 4. Aortic Atherosclerosis (ICD10-I70.0). Electronically Signed   By: Lovey Newcomer M.D.   On: 10/03/2018 12:54   Ct Pelvis Wo Contrast  Result Date: 10/02/2018 CLINICAL DATA:  Left sacroiliac joint pain. EXAM: CT PELVIS WITHOUT CONTRAST TECHNIQUE: Multidetector CT imaging of the pelvis was performed following the standard  protocol without intravenous contrast. COMPARISON:  CT scan of February 17, 2014. FINDINGS: Urinary Tract: Wall thickening is seen involving superior portion of urinary bladder. Bowel: Sigmoid diverticulosis is noted. There is no evidence of bowel obstruction. Wall thickening of distal sigmoid colon and rectum is noted with surrounding inflammation consistent with proctocolitis or possibly neoplasm. Vascular/Lymphatic: Atherosclerosis of abdominal aorta is noted. Left inguinal lymph node measuring 1.1 cm is noted. 9 mm left common iliac lymph node is noted. Reproductive: No mass or other significant abnormality. Status post hysterectomy. Other:  No hernia or abnormal fluid collection is noted. Musculoskeletal: Multiple lytic lesions are seen in the pelvis and sacrum, consistent with metastatic  disease. Largest lesion is seen posteriorly in right sacrum. IMPRESSION: Multiple lytic lesions are noted in the pelvis and sacrum, with the largest seen posteriorly in right sacrum. These are consistent with metastatic disease or possibly multiple myeloma. Wall thickening of distal sigmoid colon and rectum is noted with surrounding inflammation most consistent with proctocolitis, although neoplasm cannot be excluded. Sigmoidoscopy is recommended for further evaluation. Left inguinal and common iliac adenopathy is noted which may be inflammatory in etiology, but malignancy cannot be excluded. Wall thickening is seen involving the superior portion of urinary bladder; neoplasm cannot be excluded and cystoscopy is recommended. Electronically Signed   By: Marijo Conception, M.D.   On: 10/02/2018 17:06   US Renal  Result Date: 10/03/2018 CLINICAL DATA:  Decreased urine output. History of RIGHT nephrectomy. EXAM: RENAL / URINARY TRACT ULTRASOUND COMPLETE COMPARISON:  CT pelvis dated 10/02/2018. FINDINGS: Right Kidney: Surgically removed. Left Kidney: Renal measurements: 11.9 x 6.2 x 6.2 cm = volume: 240 mL. Mild to moderate  hydronephrosis. LEFT renal cyst measures 2.4 cm. Bladder: Appears normal for degree of bladder distention. LEFT ureteral jet is visualized. IMPRESSION: 1. LEFT renal hydronephrosis, mild to moderate in degree. Suspect some degree of associated obstruction at the level of the bladder wall thickening as described on recent pelvis CT of 10/02/2018. However, a LEFT ureteral jet is visualized at the level of the bladder indicating patency of the LEFT UVJ. 2. Status post RIGHT nephrectomy. Electronically Signed   By: Franki Cabot M.D.   On: 10/03/2018 18:54   Dg Chest Port 1 View  Result Date: 10/04/2018 CLINICAL DATA:  84 year old female. Pleural effusion. Subsequent encounter. EXAM: PORTABLE CHEST 1 VIEW COMPARISON:  10/03/2018 chest CT and 10/02/2018 chest x-ray. FINDINGS: Rotation to the right. When compared to prior chest x-ray, progressive pulmonary vascular congestion/pulmonary edema. Increase in size of left-sided pleural effusion. Left base consolidation may represent atelectasis or infiltrate. Cardiomegaly. IMPRESSION: 1. Progressive pulmonary vascular congestion/pulmonary edema. 2. Increase in size of left-sided pleural effusion. Left base consolidation may represent atelectasis or infiltrate. 3. Cardiomegaly. Electronically Signed   By: Genia Del M.D.   On: 10/04/2018 07:03   Scheduled Meds: . aspirin EC  81 mg Oral Daily  . enoxaparin (LOVENOX) injection  30 mg Subcutaneous QHS  . ipratropium-albuterol  3 mL Nebulization BID  . lidocaine       Continuous Infusions:   LOS: 2 days   Kerney Elbe, DO [facility] PAGER is on [place]  If 7PM-7AM, please contact night-coverage [email or web address] Password [email or web address] 10/04/2018, 3:55 PM

## 2018-10-04 NOTE — Consult Note (Signed)
Chief Complaint: Patient was seen in consultation today for US guided left subpectoral nodal mass biopsy Chief Complaint  Patient presents with  . Altered Mental Status    Referring Physician(s): Sherrill,B  Supervising Physician: Corrie Mckusick  Patient Status: [facility] - In-pt  History of Present Illness: [name] is a 84 y.o. female with history of left breast cancer/DCIS in 2001, status post mastectomy who was recently admitted to [facility] with hypercalcemia, slight confusion, mild dyspnea and back pain.  Imaging has revealed a large left subpectoral lymph node with additional axillary adenopathy, bony lytic lesions, bilateral pleural effusions.  She is status post left thoracentesis earlier today yielding approximately 1 L fluid.  Request now received from oncology for left subpectoral nodal mass biopsy for further evaluation.  Past Medical History:  Diagnosis Date  . Barrett esophagus   . Hx of skin cancer, basal cell   . HX: breast cancer   . Hyperlipemia   . Hypertension   . IBS (irritable bowel syndrome)   . Neck pain   . Osteoarthritis   . Osteoporosis   . Prediabetes     Past Surgical History:  Procedure Laterality Date  . ABDOMINAL HYSTERECTOMY    . APPENDECTOMY    . Basal Cell Carcinoma with focal sclerosis resection     Left shin  . CATARACT EXTRACTION Bilateral   . CHOLECYSTECTOMY     1975  . MASTECTOMY Left    2001  . NEPHRECTOMY Right    1955    Allergies: Patient has no known allergies.  Medications: Prior to Admission medications   Medication Sig Start Date End Date Taking? Authorizing Provider  Ascorbic Acid (VITAMIN C) 1000 MG tablet Take 1,000 mg by mouth daily.   Yes [provider]  aspirin EC 81 MG tablet Take 81 mg by mouth daily.   Yes [provider]  Biotin (BIOTIN MAXIMUM STRENGTH) 10 MG TABS Take by mouth daily.   Yes [provider]  Cholecalciferol (VITAMIN D-3 PO) Take by mouth  daily.   Yes [provider]  hydrochlorothiazide (HYDRODIURIL) 12.5 MG tablet Take 12.5 mg by mouth daily.   Yes [provider]  losartan-hydrochlorothiazide (HYZAAR) 100-12.5 MG tablet Take 1 tablet by mouth daily. 08/06/18  Yes [provider]  Magnesium 400 MG CAPS Take by mouth daily.   Yes [provider]  Omega-3 Fatty Acids (FISH OIL) 1000 MG CAPS Take by mouth daily.   Yes [provider]  oxyCODONE-acetaminophen (PERCOCET) 5-325 MG tablet Take 1 tablet by mouth every 8 (eight) hours as needed for severe pain. 08/28/15  Yes Marcial Pacas, MD  TURMERIC PO Take by mouth daily.   Yes [provider]     Family History  Problem Relation Age of Onset  . Congestive Heart Failure Mother   . Pneumonia Father   . Dementia Father     Social History   Socioeconomic History  . Marital status: Married    Spouse name: Not on file  . Number of children: 2  . Years of education: 35  . Highest education level: Not on file  Occupational History  . Occupation: Retired  Scientific laboratory technician  . Financial resource strain: Not on file  . Food insecurity:    Worry: Not on file    Inability: Not on file  . Transportation needs:    Medical: Not on file    Non-medical: Not on file  Tobacco Use  . Smoking status: Former  Smoker    Types: Cigarettes  . Smokeless tobacco: Never Used  . Tobacco comment: Quit 1970  Substance and Sexual Activity  . Alcohol use: Yes    Alcohol/week: 0.0 standard drinks    Comment: Occasional glass of wine  . Drug use: No  . Sexual activity: Not on file  Lifestyle  . Physical activity:    Days per week: Not on file    Minutes per session: Not on file  . Stress: Not on file  Relationships  . Social connections:    Talks on phone: Not on file    Gets together: Not on file    Attends religious service: Not on file    Active member of club or organization: Not on file    Attends meetings of clubs or organizations:  Not on file    Relationship status: Not on file  Other Topics Concern  . Not on file  Social History Narrative   Lives at home with husband.   Right-handed.   2-3 cups caffeine per day.      Review of Systems see above; denies fever, HA,CP, abd pain,N/V or bleeding  Vital Signs: BP (!) 131/44   Pulse 90   Temp (!) 97.5 F (36.4 C) (Oral)   Resp 14   Ht '5\' 3"'$  (1.6 m)   Wt 117 lb 15.1 oz (53.5 kg)   SpO2 93%   BMI 20.89 kg/m   Physical Exam patient awake but drowsy.  Alert to name and birthdate as well as location but not year.  Chest with slight diminished breath sounds bases.  Heart with regular rate and rhythm.  Abdomen soft, positive bowel sounds, nontender.  No lower extremity edema.  Left supraclavicular fossa fullness noted  Imaging: Dg Chest 1 View  Result Date: 10/04/2018 CLINICAL DATA:  Status post left thoracentesis. EXAM: CHEST  1 VIEW COMPARISON:  Radiograph of October 04, 2018. FINDINGS: Stable cardiomediastinal silhouette. No pneumothorax is noted. Left pleural effusion noted on prior exam is significantly decreased. Mild right basilar atelectasis and effusion is noted. Residual left basilar subsegmental atelectasis is noted. Bony thorax is unremarkable. IMPRESSION: Significantly decreased left pleural effusion status post thoracentesis. No pneumothorax is noted. Electronically Signed   By: Marijo Conception, M.D.   On: 10/04/2018 12:40   Dg Chest 2 View  Result Date: 10/02/2018 CLINICAL DATA:  Chest pain. EXAM: CHEST - 2 VIEW COMPARISON:  Radiographs of September 07, 2018. FINDINGS: Stable cardiomediastinal silhouette. No pneumothorax is noted. Mild left pleural effusion is noted with probable underlying atelectasis or infiltrate. Minimal right pleural effusion is noted. Bony thorax is unremarkable. IMPRESSION: Mild left pleural effusion is noted with probable underlying atelectasis or infiltrate. Minimal right pleural effusion. Electronically Signed   By: Marijo Conception, M.D.   On: 10/02/2018 16:22   Ct Head Wo Contrast  Result Date: 10/02/2018 CLINICAL DATA:  Patient with chest pain.  Nausea. EXAM: CT HEAD WITHOUT CONTRAST TECHNIQUE: Contiguous axial images were obtained from the base of the skull through the vertex without intravenous contrast. COMPARISON:  None. FINDINGS: Brain: Ventricles and sulci are appropriate for patient's age. No evidence for acute cortically based infarct, intracranial hemorrhage, mass lesion or mass-effect. Vascular: Unremarkable Skull: Intact. Sinuses/Orbits: Paranasal sinuses well aerated. Mastoid air cells unremarkable. Orbits unremarkable. Other: None. IMPRESSION: No acute intracranial process. Electronically Signed   By: Lovey Newcomer M.D.   On: 10/02/2018 16:59   Ct Soft Tissue Neck Wo Contrast  Result Date: 10/03/2018 CLINICAL  DATA:  Metastatic cancer. EXAM: CT NECK WITHOUT CONTRAST TECHNIQUE: Multidetector CT imaging of the neck was performed following the standard protocol without intravenous contrast. COMPARISON:  None. FINDINGS: Pharynx and larynx: No mucosal or submucosal lesion. Salivary glands: Parotid and submandibular glands are normal. Thyroid: Multiple thyroid nodules. This could be benign goiter. Consider thyroid ultrasound for more accurate evaluation. Lymph nodes: No enlarged or low-density nodes in the neck region. There slightly prominent nodes in the supraclavicular fat on both sides. These are nonspecific. They could represent reactive nodes or malignancy. Vascular: Ordinary atherosclerotic calcification of the carotid bifurcation regions. Limited intracranial: Negative Visualized orbits: Normal Mastoids and visualized paranasal sinuses: Clear Skeleton: Lytic foci scattered throughout the cervical and thoracic spine consistent with osseous metastatic disease. No evidence of pathologic fracture in the cervical region. Possible pathologic fracture at T4. No discernible tumor involvement of the canal, but CT is not  sensitive. Upper chest: See results of chest CT Other: None IMPRESSION: Lytic metastatic lesions affecting the cervical and upper thoracic spine. No evidence of pathologic fracture in the cervical region. Probable pathologic fracture of T4. See results of chest CT. Slightly prominent supraclavicular lymph nodes on each side. No abnormal nodes higher in the neck. Multiple thyroid nodules. Most likely diagnosis is benign goiter, but thyroid ultrasound could be performed for more accurate evaluation if primary carcinoma is not identified elsewhere. In the absence of regional lymph nodes, it would be unusual for this to be thyroid carcinoma. Electronically Signed   By: Nelson Chimes M.D.   On: 10/03/2018 10:55   Ct Chest Wo Contrast  Result Date: 10/03/2018 CLINICAL DATA:  Patient with altered mental status. Lytic lesions within the pelvis. Effusion. EXAM: CT CHEST WITHOUT CONTRAST TECHNIQUE: Multidetector CT imaging of the chest was performed following the standard protocol without IV contrast. COMPARISON:  CT pelvis 10/02/2018; chest radiograph 10/02/2018 FINDINGS: Cardiovascular: Heart is enlarged. Trace pericardial effusion. Thoracic aortic vascular calcification. Mediastinum/Nodes: Prominent 9 mm left axillary lymph node (image 46; series 2). 2.6 x 3.5 cm left subpectoral lymph node. 1.2 cm right axillary lymph node (image 41; series 2). 1.0 cm node superior mediastinum (image 40; series 2). Normal appearance of the esophagus. Heterogeneous thyroid. Lungs/Pleura: Central airways are patent. Moderate left and small right pleural effusions. Subpleural consolidation within the left and right lower lobes. No pneumothorax. Upper Abdomen: Small volume perihepatic ascites. Musculoskeletal: Posterior left ninth, tenth and eleventh healing rib fractures. Healing posterior right sixth rib fracture. Multiple healing lateral right rib fractures involving the third and seventh ribs. Mixed lucent and sclerotic lesions  involving the T1, T2, T4 and T12 vertebral bodies. There is a mild pathologic fracture of the T4 vertebral body with associated height loss. Prior left mastectomy. Small lucent lesions involving the ribs bilaterally. IMPRESSION: 1. Large left subpectoral lymph node. Additionally there is prominent left axillary and enlarged right axillary adenopathy. Findings are concerning for metastatic disease. 2. Lytic lesions involving the thoracic spine and ribs concerning for osseous metastatic disease. There is mild height loss of the T4 vertebral body most compatible with pathologic fracture. 3. Moderate left and small right layering pleural effusions. There is underlying consolidation within the adjacent pulmonary parenchyma which may represent atelectasis or infection. 4. Aortic Atherosclerosis (ICD10-I70.0). Electronically Signed   By: Lovey Newcomer M.D.   On: 10/03/2018 12:54   Ct Pelvis Wo Contrast  Result Date: 10/02/2018 CLINICAL DATA:  Left sacroiliac joint pain. EXAM: CT PELVIS WITHOUT CONTRAST TECHNIQUE: Multidetector CT imaging of the pelvis was  performed following the standard protocol without intravenous contrast. COMPARISON:  CT scan of February 17, 2014. FINDINGS: Urinary Tract: Wall thickening is seen involving superior portion of urinary bladder. Bowel: Sigmoid diverticulosis is noted. There is no evidence of bowel obstruction. Wall thickening of distal sigmoid colon and rectum is noted with surrounding inflammation consistent with proctocolitis or possibly neoplasm. Vascular/Lymphatic: Atherosclerosis of abdominal aorta is noted. Left inguinal lymph node measuring 1.1 cm is noted. 9 mm left common iliac lymph node is noted. Reproductive: No mass or other significant abnormality. Status post hysterectomy. Other:  No hernia or abnormal fluid collection is noted. Musculoskeletal: Multiple lytic lesions are seen in the pelvis and sacrum, consistent with metastatic disease. Largest lesion is seen posteriorly in  right sacrum. IMPRESSION: Multiple lytic lesions are noted in the pelvis and sacrum, with the largest seen posteriorly in right sacrum. These are consistent with metastatic disease or possibly multiple myeloma. Wall thickening of distal sigmoid colon and rectum is noted with surrounding inflammation most consistent with proctocolitis, although neoplasm cannot be excluded. Sigmoidoscopy is recommended for further evaluation. Left inguinal and common iliac adenopathy is noted which may be inflammatory in etiology, but malignancy cannot be excluded. Wall thickening is seen involving the superior portion of urinary bladder; neoplasm cannot be excluded and cystoscopy is recommended. Electronically Signed   By: Marijo Conception, M.D.   On: 10/02/2018 17:06   US Renal  Result Date: 10/03/2018 CLINICAL DATA:  Decreased urine output. History of RIGHT nephrectomy. EXAM: RENAL / URINARY TRACT ULTRASOUND COMPLETE COMPARISON:  CT pelvis dated 10/02/2018. FINDINGS: Right Kidney: Surgically removed. Left Kidney: Renal measurements: 11.9 x 6.2 x 6.2 cm = volume: 240 mL. Mild to moderate hydronephrosis. LEFT renal cyst measures 2.4 cm. Bladder: Appears normal for degree of bladder distention. LEFT ureteral jet is visualized. IMPRESSION: 1. LEFT renal hydronephrosis, mild to moderate in degree. Suspect some degree of associated obstruction at the level of the bladder wall thickening as described on recent pelvis CT of 10/02/2018. However, a LEFT ureteral jet is visualized at the level of the bladder indicating patency of the LEFT UVJ. 2. Status post RIGHT nephrectomy. Electronically Signed   By: Franki Cabot M.D.   On: 10/03/2018 18:54   US Venous Img Lower Bilateral  Result Date: 09/27/2018 CLINICAL DATA:  Bilateral lower extremity edema and pain. EXAM: BILATERAL LOWER EXTREMITY VENOUS DOPPLER ULTRASOUND TECHNIQUE: Gray-scale sonography with graded compression, as well as color Doppler and duplex ultrasound were performed  to evaluate the lower extremity deep venous systems from the level of the common femoral vein and including the common femoral, femoral, profunda femoral, popliteal and calf veins including the posterior tibial, peroneal and gastrocnemius veins when visible. The superficial great saphenous vein was also interrogated. Spectral Doppler was utilized to evaluate flow at rest and with distal augmentation maneuvers in the common femoral, femoral and popliteal veins. COMPARISON:  None. FINDINGS: RIGHT LOWER EXTREMITY Common Femoral Vein: No evidence of thrombus. Normal compressibility, respiratory phasicity and response to augmentation. Saphenofemoral Junction: No evidence of thrombus. Normal compressibility and flow on color Doppler imaging. Profunda Femoral Vein: No evidence of thrombus. Normal compressibility and flow on color Doppler imaging. Femoral Vein: No evidence of thrombus. Normal compressibility, respiratory phasicity and response to augmentation. Popliteal Vein: No evidence of thrombus. Normal compressibility, respiratory phasicity and response to augmentation. Calf Veins: No evidence of thrombus. Normal compressibility and flow on color Doppler imaging. Superficial Great Saphenous Vein: No evidence of thrombus. Normal compressibility. Venous Reflux:  None. Other Findings: No evidence of superficial thrombophlebitis or abnormal fluid collection. LEFT LOWER EXTREMITY Common Femoral Vein: No evidence of thrombus. Normal compressibility, respiratory phasicity and response to augmentation. Saphenofemoral Junction: No evidence of thrombus. Normal compressibility and flow on color Doppler imaging. Profunda Femoral Vein: No evidence of thrombus. Normal compressibility and flow on color Doppler imaging. Femoral Vein: No evidence of thrombus. Normal compressibility, respiratory phasicity and response to augmentation. Popliteal Vein: No evidence of thrombus. Normal compressibility, respiratory phasicity and response to  augmentation. Calf Veins: No evidence of thrombus. Normal compressibility and flow on color Doppler imaging. Superficial Great Saphenous Vein: No evidence of thrombus. Normal compressibility. Venous Reflux:  None. Other Findings: No evidence of superficial thrombophlebitis or abnormal fluid collection. IMPRESSION: No evidence of bilateral lower extremity deep venous thrombosis. Electronically Signed   By: Aletta Edouard M.D.   On: 09/27/2018 16:42   Dg Chest Port 1 View  Result Date: 10/04/2018 CLINICAL DATA:  84 year old female. Pleural effusion. Subsequent encounter. EXAM: PORTABLE CHEST 1 VIEW COMPARISON:  10/03/2018 chest CT and 10/02/2018 chest x-ray. FINDINGS: Rotation to the right. When compared to prior chest x-ray, progressive pulmonary vascular congestion/pulmonary edema. Increase in size of left-sided pleural effusion. Left base consolidation may represent atelectasis or infiltrate. Cardiomegaly. IMPRESSION: 1. Progressive pulmonary vascular congestion/pulmonary edema. 2. Increase in size of left-sided pleural effusion. Left base consolidation may represent atelectasis or infiltrate. 3. Cardiomegaly. Electronically Signed   By: Genia Del M.D.   On: 10/04/2018 07:03    Labs:  CBC: Recent Labs    10/02/18 1529 10/03/18 0542 10/04/18 0549  WBC 12.9* 11.4* 14.0*  HGB 10.5* 10.1* 9.5*  HCT 34.0* 32.0* 31.5*  PLT 323 320 300    COAGS: No results for input(s): INR, APTT in the last 8760 hours.  BMP: Recent Labs    10/02/18 1529 10/03/18 0542 10/03/18 1606 10/04/18 0549  NA 132* 132* 132* 132*  K 3.6 3.5 3.5 3.8  CL 91* 96* 98 101  CO2 '31 27 26 22  '$ GLUCOSE 115* 108* 96 85  BUN 27* 25* 27* 26*  CALCIUM 14.3* 13.3* 12.6* 11.5*  CREATININE 0.97 0.97 1.04* 1.13*  GFRNONAA 51* 51* 47* 42*  GFRAA 59* 59* 54* 49*    LIVER FUNCTION TESTS: Recent Labs    10/02/18 1529 10/03/18 0542 10/04/18 0549  BILITOT 0.8 0.6 0.5  AST 69* 64* 62*  ALT '24 24 28  '$ ALKPHOS 124 104  93  PROT 7.0 5.9* 5.3*  ALBUMIN 3.2* 2.6* 2.3*    TUMOR MARKERS: No results for input(s): AFPTM, CEA, CA199, CHROMGRNA in the last 8760 hours.  Assessment and Plan:  85 y.o. female with history of left breast cancer/DCIS in 2001, status post mastectomy who was recently admitted to [facility] with hypercalcemia, slight confusion, mild dyspnea and back pain.  Imaging has revealed a large left subpectoral lymph node with additional axillary adenopathy, bony lytic lesions, bilateral pleural effusions.  She is status post left thoracentesis earlier today yielding approximately 1 L fluid.  Request now received from oncology for left subpectoral nodal mass biopsy for further evaluation.  Imaging studies have been reviewed by Dr. Anselm Pancoast.Risks and benefits discussed with the patient/family including, but not limited to bleeding, infection, damage to adjacent structures or low yield requiring additional tests.  All of the patient's questions were answered, patient is agreeable to proceed. Consent signed and in chart.  Procedure tentatively scheduled for 11/12    Thank you for this interesting consult.  I greatly enjoyed meeting [name] and look forward to participating in their care.  A copy of this report was sent to the requesting provider on this date.  Electronically Signed: D. Rowe Robert, PA-C 10/04/2018, 4:24 PM   I spent a total of 25 minutes    in face to face in clinical consultation, greater than 50% of which was counseling/coordinating care for ultrasound-guided left subpectoral lymph node biopsy

## 2018-10-04 NOTE — Progress Notes (Signed)
  Echocardiogram 2D Echocardiogram has been performed.  [name] 10/04/2018, 2:38 PM

## 2018-10-04 NOTE — Progress Notes (Signed)
IP PROGRESS NOTE  Subjective:   [name] complains of back pain.  Mild dyspnea.  Her daughter is at the bedside.  Objective: Vital signs in last 24 hours: Blood pressure (!) 149/60, pulse 89, temperature 97.9 F (36.6 C), temperature source Oral, resp. rate 17, height '5\' 3"'$  (1.6 m), weight 117 lb 15.1 oz (53.5 kg), SpO2 100 %.  Intake/Output from previous day: 11/10 0701 - 11/11 0700 In: 2924 [P.O.:270; I.V.:2654] Out: 675 [Urine:675]  Physical Exam:  HEENT: Fullness at the left supraclavicular fossa Lungs: Decreased breath sounds at the lower chest bilaterally.  Inspiratory rhonchi at the left upper anterior chest Cardiac: Regular rate and rhythm Extremities: No leg edema Neurologic: Alert, not oriented     Lab Results: Recent Labs    10/03/18 0542 10/04/18 0549  WBC 11.4* 14.0*  HGB 10.1* 9.5*  HCT 32.0* 31.5*  PLT 320 300    BMET Recent Labs    10/03/18 1606 10/04/18 0549  NA 132* 132*  K 3.5 3.8  CL 98 101  CO2 26 22  GLUCOSE 96 85  BUN 27* 26*  CREATININE 1.04* 1.13*  CALCIUM 12.6* 11.5*    No results found for: CEA1  Studies/Results: Dg Chest 2 View  Result Date: 10/02/2018 CLINICAL DATA:  Chest pain. EXAM: CHEST - 2 VIEW COMPARISON:  Radiographs of September 07, 2018. FINDINGS: Stable cardiomediastinal silhouette. No pneumothorax is noted. Mild left pleural effusion is noted with probable underlying atelectasis or infiltrate. Minimal right pleural effusion is noted. Bony thorax is unremarkable. IMPRESSION: Mild left pleural effusion is noted with probable underlying atelectasis or infiltrate. Minimal right pleural effusion. Electronically Signed   By: Marijo Conception, M.D.   On: 10/02/2018 16:22   Ct Head Wo Contrast  Result Date: 10/02/2018 CLINICAL DATA:  Patient with chest pain.  Nausea. EXAM: CT HEAD WITHOUT CONTRAST TECHNIQUE: Contiguous axial images were obtained from the base of the skull through the vertex without intravenous contrast.  COMPARISON:  None. FINDINGS: Brain: Ventricles and sulci are appropriate for patient's age. No evidence for acute cortically based infarct, intracranial hemorrhage, mass lesion or mass-effect. Vascular: Unremarkable Skull: Intact. Sinuses/Orbits: Paranasal sinuses well aerated. Mastoid air cells unremarkable. Orbits unremarkable. Other: None. IMPRESSION: No acute intracranial process. Electronically Signed   By: Lovey Newcomer M.D.   On: 10/02/2018 16:59   Ct Soft Tissue Neck Wo Contrast  Result Date: 10/03/2018 CLINICAL DATA:  Metastatic cancer. EXAM: CT NECK WITHOUT CONTRAST TECHNIQUE: Multidetector CT imaging of the neck was performed following the standard protocol without intravenous contrast. COMPARISON:  None. FINDINGS: Pharynx and larynx: No mucosal or submucosal lesion. Salivary glands: Parotid and submandibular glands are normal. Thyroid: Multiple thyroid nodules. This could be benign goiter. Consider thyroid ultrasound for more accurate evaluation. Lymph nodes: No enlarged or low-density nodes in the neck region. There slightly prominent nodes in the supraclavicular fat on both sides. These are nonspecific. They could represent reactive nodes or malignancy. Vascular: Ordinary atherosclerotic calcification of the carotid bifurcation regions. Limited intracranial: Negative Visualized orbits: Normal Mastoids and visualized paranasal sinuses: Clear Skeleton: Lytic foci scattered throughout the cervical and thoracic spine consistent with osseous metastatic disease. No evidence of pathologic fracture in the cervical region. Possible pathologic fracture at T4. No discernible tumor involvement of the canal, but CT is not sensitive. Upper chest: See results of chest CT Other: None IMPRESSION: Lytic metastatic lesions affecting the cervical and upper thoracic spine. No evidence of pathologic fracture in the cervical region. Probable pathologic fracture  of T4. See results of chest CT. Slightly prominent  supraclavicular lymph nodes on each side. No abnormal nodes higher in the neck. Multiple thyroid nodules. Most likely diagnosis is benign goiter, but thyroid ultrasound could be performed for more accurate evaluation if primary carcinoma is not identified elsewhere. In the absence of regional lymph nodes, it would be unusual for this to be thyroid carcinoma. Electronically Signed   By: Nelson Chimes M.D.   On: 10/03/2018 10:55   Ct Chest Wo Contrast  Result Date: 10/03/2018 CLINICAL DATA:  Patient with altered mental status. Lytic lesions within the pelvis. Effusion. EXAM: CT CHEST WITHOUT CONTRAST TECHNIQUE: Multidetector CT imaging of the chest was performed following the standard protocol without IV contrast. COMPARISON:  CT pelvis 10/02/2018; chest radiograph 10/02/2018 FINDINGS: Cardiovascular: Heart is enlarged. Trace pericardial effusion. Thoracic aortic vascular calcification. Mediastinum/Nodes: Prominent 9 mm left axillary lymph node (image 46; series 2). 2.6 x 3.5 cm left subpectoral lymph node. 1.2 cm right axillary lymph node (image 41; series 2). 1.0 cm node superior mediastinum (image 40; series 2). Normal appearance of the esophagus. Heterogeneous thyroid. Lungs/Pleura: Central airways are patent. Moderate left and small right pleural effusions. Subpleural consolidation within the left and right lower lobes. No pneumothorax. Upper Abdomen: Small volume perihepatic ascites. Musculoskeletal: Posterior left ninth, tenth and eleventh healing rib fractures. Healing posterior right sixth rib fracture. Multiple healing lateral right rib fractures involving the third and seventh ribs. Mixed lucent and sclerotic lesions involving the T1, T2, T4 and T12 vertebral bodies. There is a mild pathologic fracture of the T4 vertebral body with associated height loss. Prior left mastectomy. Small lucent lesions involving the ribs bilaterally. IMPRESSION: 1. Large left subpectoral lymph node. Additionally there is  prominent left axillary and enlarged right axillary adenopathy. Findings are concerning for metastatic disease. 2. Lytic lesions involving the thoracic spine and ribs concerning for osseous metastatic disease. There is mild height loss of the T4 vertebral body most compatible with pathologic fracture. 3. Moderate left and small right layering pleural effusions. There is underlying consolidation within the adjacent pulmonary parenchyma which may represent atelectasis or infection. 4. Aortic Atherosclerosis (ICD10-I70.0). Electronically Signed   By: Lovey Newcomer M.D.   On: 10/03/2018 12:54   Ct Pelvis Wo Contrast  Result Date: 10/02/2018 CLINICAL DATA:  Left sacroiliac joint pain. EXAM: CT PELVIS WITHOUT CONTRAST TECHNIQUE: Multidetector CT imaging of the pelvis was performed following the standard protocol without intravenous contrast. COMPARISON:  CT scan of February 17, 2014. FINDINGS: Urinary Tract: Wall thickening is seen involving superior portion of urinary bladder. Bowel: Sigmoid diverticulosis is noted. There is no evidence of bowel obstruction. Wall thickening of distal sigmoid colon and rectum is noted with surrounding inflammation consistent with proctocolitis or possibly neoplasm. Vascular/Lymphatic: Atherosclerosis of abdominal aorta is noted. Left inguinal lymph node measuring 1.1 cm is noted. 9 mm left common iliac lymph node is noted. Reproductive: No mass or other significant abnormality. Status post hysterectomy. Other:  No hernia or abnormal fluid collection is noted. Musculoskeletal: Multiple lytic lesions are seen in the pelvis and sacrum, consistent with metastatic disease. Largest lesion is seen posteriorly in right sacrum. IMPRESSION: Multiple lytic lesions are noted in the pelvis and sacrum, with the largest seen posteriorly in right sacrum. These are consistent with metastatic disease or possibly multiple myeloma. Wall thickening of distal sigmoid colon and rectum is noted with surrounding  inflammation most consistent with proctocolitis, although neoplasm cannot be excluded. Sigmoidoscopy is recommended for further evaluation. Left  inguinal and common iliac adenopathy is noted which may be inflammatory in etiology, but malignancy cannot be excluded. Wall thickening is seen involving the superior portion of urinary bladder; neoplasm cannot be excluded and cystoscopy is recommended. Electronically Signed   By: Marijo Conception, M.D.   On: 10/02/2018 17:06   US Renal  Result Date: 10/03/2018 CLINICAL DATA:  Decreased urine output. History of RIGHT nephrectomy. EXAM: RENAL / URINARY TRACT ULTRASOUND COMPLETE COMPARISON:  CT pelvis dated 10/02/2018. FINDINGS: Right Kidney: Surgically removed. Left Kidney: Renal measurements: 11.9 x 6.2 x 6.2 cm = volume: 240 mL. Mild to moderate hydronephrosis. LEFT renal cyst measures 2.4 cm. Bladder: Appears normal for degree of bladder distention. LEFT ureteral jet is visualized. IMPRESSION: 1. LEFT renal hydronephrosis, mild to moderate in degree. Suspect some degree of associated obstruction at the level of the bladder wall thickening as described on recent pelvis CT of 10/02/2018. However, a LEFT ureteral jet is visualized at the level of the bladder indicating patency of the LEFT UVJ. 2. Status post RIGHT nephrectomy. Electronically Signed   By: Franki Cabot M.D.   On: 10/03/2018 18:54   Dg Chest Port 1 View  Result Date: 10/04/2018 CLINICAL DATA:  84 year old female. Pleural effusion. Subsequent encounter. EXAM: PORTABLE CHEST 1 VIEW COMPARISON:  10/03/2018 chest CT and 10/02/2018 chest x-ray. FINDINGS: Rotation to the right. When compared to prior chest x-ray, progressive pulmonary vascular congestion/pulmonary edema. Increase in size of left-sided pleural effusion. Left base consolidation may represent atelectasis or infiltrate. Cardiomegaly. IMPRESSION: 1. Progressive pulmonary vascular congestion/pulmonary edema. 2. Increase in size of left-sided  pleural effusion. Left base consolidation may represent atelectasis or infiltrate. 3. Cardiomegaly. Electronically Signed   By: Genia Del M.D.   On: 10/04/2018 07:03    Medications: I have reviewed the patient's current medications.  Assessment/Plan:  1.  Hypercalcemia-status post Zometa on 10/02/2018, improved 2.  Lytic bone lesions on CT pelvis 10/02/2018 3.  Rectal and bladder wall thickening on CT pelvis 10/02/2018 4.  History of left breast DCIS, status post a left mastectomy in 2001 5.  Altered mental status secondary to #1 6.  History of Barrett's esophagus 7.  Osteoporosis 8.  Nephrectomy in 1955 9.  Pain secondary to lytic bone lesions 10. Left neck mass 11. Anemia  [name] was admitted with hypercalcemia and pain.  The CTs yesterday confirm subpectoral, axillary, and mediastinal adenopathy.  She has bilateral effusions and multiple bone lesions/fractures.  I discussed the differential diagnosis and reviewed CT images with her family yesterday.  I will refer her to interventional radiology for a diagnostic biopsy.  Recommendations: 1.  Biopsy of left subpectoral lymph node 2.  Continue intravenous hydration 3.  Therapeutic thoracentesis for increased dyspnea 4.  Narcotics as needed for pain  We will continue following this wants in the hospital and make treatment recommendations when the biopsy result is available   LOS: 2 days   Betsy Coder, MD   10/04/2018, 7:22 AM

## 2018-10-04 NOTE — Consult Note (Signed)
Urology Consult   Physician requesting consult: Raiford Noble, MD  Reason for consult: Left hydronephrosis  History of Present Illness: [name] is a 84 y.o. female admitted recently with altered mental status, most likely secondary to hypercalcemia.  Imaging revealed multiple metastatic foci in her bones.  She has a solitary left kidney, having had right nephrectomy in the 1950s.  Renal ultrasound revealed hydronephrosis of her solitary kidney.  She had bladder distention on both ultrasound and CT scan of her pelvis.  Additionally, she did have bladder wall thickening in her dome, with contiguous colonic abnormality.  She has had no recent abdominal pain, change in bowel habits.  She has had difficulty with urination.  She had minimal urinary output prior to consultation last night.  Since that time, when I recommended catheter placement, she has had fairly brisk output and a stable creatinine.  She denies a history of voiding or storage urinary symptoms, hematuria, UTIs, STDs, urolithiasis, GU malignancy/trauma/surgery.  Past Medical History:  Diagnosis Date  . Barrett esophagus   . Hx of skin cancer, basal cell   . HX: breast cancer   . Hyperlipemia   . Hypertension   . IBS (irritable bowel syndrome)   . Neck pain   . Osteoarthritis   . Osteoporosis   . Prediabetes     Past Surgical History:  Procedure Laterality Date  . ABDOMINAL HYSTERECTOMY    . APPENDECTOMY    . Basal Cell Carcinoma with focal sclerosis resection     Left shin  . CATARACT EXTRACTION Bilateral   . CHOLECYSTECTOMY     1975  . MASTECTOMY Left    2001  . NEPHRECTOMY Right    1955     Current Hospital Medications: Scheduled Meds: . aspirin EC  81 mg Oral Daily  . enoxaparin (LOVENOX) injection  30 mg Subcutaneous QHS  . ipratropium-albuterol  3 mL Nebulization BID  . lidocaine       Continuous Infusions: PRN Meds:.acetaminophen **OR** acetaminophen, hydrALAZINE, HYDROcodone-acetaminophen,  ondansetron (ZOFRAN) IV, polyethylene glycol, senna, traMADol  Allergies: No Known Allergies  Family History  Problem Relation Age of Onset  . Congestive Heart Failure Mother   . Pneumonia Father   . Dementia Father     Social History:  reports that she has quit smoking. Her smoking use included cigarettes. She has never used smokeless tobacco. She reports that she drinks alcohol. She reports that she does not use drugs.  ROS: A complete review of systems was performed.  All systems are negative except for pertinent findings as noted.  Physical Exam:  Vital signs in last 24 hours: Temp:  [97.7 F (36.5 C)-97.9 F (36.6 C)] 97.9 F (36.6 C) (11/11 0529) Pulse Rate:  [81-89] 89 (11/11 0529) Resp:  [16-22] 17 (11/11 0529) BP: (135-191)/(53-68) 149/60 (11/11 0529) SpO2:  [93 %-100 %] 100 % (11/11 0529) General:  Alert and oriented, No acute distress HEENT: Normocephalic, atraumatic Neck: No JVD or lymphadenopathy Cardiovascular: Regular rate  Lungs: Normal inspiratory and expiratory excursion Abdomen: Flat, soft.  No specific tenderness.  No masses noted. Extremities: No edema Neurologic: Grossly intact  Laboratory Data:  Recent Labs    10/02/18 1529 10/03/18 0542 10/04/18 0549  WBC 12.9* 11.4* 14.0*  HGB 10.5* 10.1* 9.5*  HCT 34.0* 32.0* 31.5*  PLT 323 320 300    Recent Labs    10/02/18 1529 10/03/18 0542 10/03/18 1606 10/04/18 0549  NA 132* 132* 132* 132*  K 3.6 3.5 3.5 3.8  CL  91* 96* 98 101  GLUCOSE 115* 108* 96 85  BUN 27* 25* 27* 26*  CALCIUM 14.3* 13.3* 12.6* 11.5*  CREATININE 0.97 0.97 1.04* 1.13*     Results for orders placed or performed during the hospital encounter of 10/02/18 (from the past 24 hour(s))  Basic metabolic panel     Status: Abnormal   Collection Time: 10/03/18  4:06 PM  Result Value Ref Range   Sodium 132 (L) 135 - 145 mmol/L   Potassium 3.5 3.5 - 5.1 mmol/L   Chloride 98 98 - 111 mmol/L   CO2 26 22 - 32 mmol/L   Glucose,  Bld 96 70 - 99 mg/dL   BUN 27 (H) 8 - 23 mg/dL   Creatinine, Ser 1.04 (H) 0.44 - 1.00 mg/dL   Calcium 12.6 (H) 8.9 - 10.3 mg/dL   GFR calc non Af Amer 47 (L) >60 mL/min   GFR calc Af Amer 54 (L) >60 mL/min   Anion gap 8 5 - 15  Comprehensive metabolic panel     Status: Abnormal   Collection Time: 10/04/18  5:49 AM  Result Value Ref Range   Sodium 132 (L) 135 - 145 mmol/L   Potassium 3.8 3.5 - 5.1 mmol/L   Chloride 101 98 - 111 mmol/L   CO2 22 22 - 32 mmol/L   Glucose, Bld 85 70 - 99 mg/dL   BUN 26 (H) 8 - 23 mg/dL   Creatinine, Ser 1.13 (H) 0.44 - 1.00 mg/dL   Calcium 11.5 (H) 8.9 - 10.3 mg/dL   Total Protein 5.3 (L) 6.5 - 8.1 g/dL   Albumin 2.3 (L) 3.5 - 5.0 g/dL   AST 62 (H) 15 - 41 U/L   ALT 28 0 - 44 U/L   Alkaline Phosphatase 93 38 - 126 U/L   Total Bilirubin 0.5 0.3 - 1.2 mg/dL   GFR calc non Af Amer 42 (L) >60 mL/min   GFR calc Af Amer 49 (L) >60 mL/min   Anion gap 9 5 - 15  CBC with Differential/Platelet     Status: Abnormal   Collection Time: 10/04/18  5:49 AM  Result Value Ref Range   WBC 14.0 (H) 4.0 - 10.5 K/uL   RBC 3.70 (L) 3.87 - 5.11 MIL/uL   Hemoglobin 9.5 (L) 12.0 - 15.0 g/dL   HCT 31.5 (L) 36.0 - 46.0 %   MCV 85.1 80.0 - 100.0 fL   MCH 25.7 (L) 26.0 - 34.0 pg   MCHC 30.2 30.0 - 36.0 g/dL   RDW 16.0 (H) 11.5 - 15.5 %   Platelets 300 150 - 400 K/uL   nRBC 0.0 0.0 - 0.2 %   Neutrophils Relative % 82 %   Neutro Abs 11.7 (H) 1.7 - 7.7 K/uL   Lymphocytes Relative 6 %   Lymphs Abs 0.8 0.7 - 4.0 K/uL   Monocytes Relative 6 %   Monocytes Absolute 0.9 0.1 - 1.0 K/uL   Eosinophils Relative 1 %   Eosinophils Absolute 0.1 0.0 - 0.5 K/uL   Basophils Relative 1 %   Basophils Absolute 0.1 0.0 - 0.1 K/uL   Immature Granulocytes 4 %   Abs Immature Granulocytes 0.51 (H) 0.00 - 0.07 K/uL  Magnesium     Status: None   Collection Time: 10/04/18  5:49 AM  Result Value Ref Range   Magnesium 1.7 1.7 - 2.4 mg/dL  Phosphorus     Status: None   Collection Time:  10/04/18  5:49 AM  Result Value  Ref Range   Phosphorus 3.1 2.5 - 4.6 mg/dL  Brain natriuretic peptide     Status: Abnormal   Collection Time: 10/04/18  5:49 AM  Result Value Ref Range   B Natriuretic Peptide 243.6 (H) 0.0 - 100.0 pg/mL   Recent Results (from the past 240 hour(s))  Urine Culture     Status: None   Collection Time: 10/02/18  7:20 PM  Result Value Ref Range Status   Specimen Description   Final    URINE, CLEAN CATCH Performed at [facility], [place] [address], [place], [place] [address]    Special Requests   Final    NONE Performed at [facility], [place] [address] [place], [place] [address]    Culture   Final    NO GROWTH Performed at [facility], [place] [address] [place],  [address]    Report Status 10/04/2018 FINAL  Final    Renal Function: Recent Labs    10/02/18 1529 10/03/18 0542 10/03/18 1606 10/04/18 0549  CREATININE 0.97 0.97 1.04* 1.13*   Estimated Creatinine Clearance: 29 mL/min (A) (by C-G formula based on SCr of 1.13 mg/dL (H)).  Radiologic Imaging: Dg Chest 2 View  Result Date: 10/02/2018 CLINICAL DATA:  Chest pain. EXAM: CHEST - 2 VIEW COMPARISON:  Radiographs of September 07, 2018. FINDINGS: Stable cardiomediastinal silhouette. No pneumothorax is noted. Mild left pleural effusion is noted with probable underlying atelectasis or infiltrate. Minimal right pleural effusion is noted. Bony thorax is unremarkable. IMPRESSION: Mild left pleural effusion is noted with probable underlying atelectasis or infiltrate. Minimal right pleural effusion. Electronically Signed   By: Marijo Conception, M.D.   On: 10/02/2018 16:22   Ct Head Wo Contrast  Result Date: 10/02/2018 CLINICAL DATA:  Patient with chest pain.  Nausea. EXAM: CT HEAD WITHOUT CONTRAST TECHNIQUE: Contiguous axial images were obtained from the base of the skull through the vertex without intravenous contrast. COMPARISON:  None. FINDINGS: Brain:  Ventricles and sulci are appropriate for patient's age. No evidence for acute cortically based infarct, intracranial hemorrhage, mass lesion or mass-effect. Vascular: Unremarkable Skull: Intact. Sinuses/Orbits: Paranasal sinuses well aerated. Mastoid air cells unremarkable. Orbits unremarkable. Other: None. IMPRESSION: No acute intracranial process. Electronically Signed   By: Lovey Newcomer M.D.   On: 10/02/2018 16:59   Ct Soft Tissue Neck Wo Contrast  Result Date: 10/03/2018 CLINICAL DATA:  Metastatic cancer. EXAM: CT NECK WITHOUT CONTRAST TECHNIQUE: Multidetector CT imaging of the neck was performed following the standard protocol without intravenous contrast. COMPARISON:  None. FINDINGS: Pharynx and larynx: No mucosal or submucosal lesion. Salivary glands: Parotid and submandibular glands are normal. Thyroid: Multiple thyroid nodules. This could be benign goiter. Consider thyroid ultrasound for more accurate evaluation. Lymph nodes: No enlarged or low-density nodes in the neck region. There slightly prominent nodes in the supraclavicular fat on both sides. These are nonspecific. They could represent reactive nodes or malignancy. Vascular: Ordinary atherosclerotic calcification of the carotid bifurcation regions. Limited intracranial: Negative Visualized orbits: Normal Mastoids and visualized paranasal sinuses: Clear Skeleton: Lytic foci scattered throughout the cervical and thoracic spine consistent with osseous metastatic disease. No evidence of pathologic fracture in the cervical region. Possible pathologic fracture at T4. No discernible tumor involvement of the canal, but CT is not sensitive. Upper chest: See results of chest CT Other: None IMPRESSION: Lytic metastatic lesions affecting the cervical and upper thoracic spine. No evidence of pathologic fracture in the cervical region. Probable pathologic fracture of T4.  See results of chest CT. Slightly prominent supraclavicular lymph nodes on each side. No  abnormal nodes higher in the neck. Multiple thyroid nodules. Most likely diagnosis is benign goiter, but thyroid ultrasound could be performed for more accurate evaluation if primary carcinoma is not identified elsewhere. In the absence of regional lymph nodes, it would be unusual for this to be thyroid carcinoma. Electronically Signed   By: Nelson Chimes M.D.   On: 10/03/2018 10:55   Ct Chest Wo Contrast  Result Date: 10/03/2018 CLINICAL DATA:  Patient with altered mental status. Lytic lesions within the pelvis. Effusion. EXAM: CT CHEST WITHOUT CONTRAST TECHNIQUE: Multidetector CT imaging of the chest was performed following the standard protocol without IV contrast. COMPARISON:  CT pelvis 10/02/2018; chest radiograph 10/02/2018 FINDINGS: Cardiovascular: Heart is enlarged. Trace pericardial effusion. Thoracic aortic vascular calcification. Mediastinum/Nodes: Prominent 9 mm left axillary lymph node (image 46; series 2). 2.6 x 3.5 cm left subpectoral lymph node. 1.2 cm right axillary lymph node (image 41; series 2). 1.0 cm node superior mediastinum (image 40; series 2). Normal appearance of the esophagus. Heterogeneous thyroid. Lungs/Pleura: Central airways are patent. Moderate left and small right pleural effusions. Subpleural consolidation within the left and right lower lobes. No pneumothorax. Upper Abdomen: Small volume perihepatic ascites. Musculoskeletal: Posterior left ninth, tenth and eleventh healing rib fractures. Healing posterior right sixth rib fracture. Multiple healing lateral right rib fractures involving the third and seventh ribs. Mixed lucent and sclerotic lesions involving the T1, T2, T4 and T12 vertebral bodies. There is a mild pathologic fracture of the T4 vertebral body with associated height loss. Prior left mastectomy. Small lucent lesions involving the ribs bilaterally. IMPRESSION: 1. Large left subpectoral lymph node. Additionally there is prominent left axillary and enlarged right  axillary adenopathy. Findings are concerning for metastatic disease. 2. Lytic lesions involving the thoracic spine and ribs concerning for osseous metastatic disease. There is mild height loss of the T4 vertebral body most compatible with pathologic fracture. 3. Moderate left and small right layering pleural effusions. There is underlying consolidation within the adjacent pulmonary parenchyma which may represent atelectasis or infection. 4. Aortic Atherosclerosis (ICD10-I70.0). Electronically Signed   By: Lovey Newcomer M.D.   On: 10/03/2018 12:54   Ct Pelvis Wo Contrast  Result Date: 10/02/2018 CLINICAL DATA:  Left sacroiliac joint pain. EXAM: CT PELVIS WITHOUT CONTRAST TECHNIQUE: Multidetector CT imaging of the pelvis was performed following the standard protocol without intravenous contrast. COMPARISON:  CT scan of February 17, 2014. FINDINGS: Urinary Tract: Wall thickening is seen involving superior portion of urinary bladder. Bowel: Sigmoid diverticulosis is noted. There is no evidence of bowel obstruction. Wall thickening of distal sigmoid colon and rectum is noted with surrounding inflammation consistent with proctocolitis or possibly neoplasm. Vascular/Lymphatic: Atherosclerosis of abdominal aorta is noted. Left inguinal lymph node measuring 1.1 cm is noted. 9 mm left common iliac lymph node is noted. Reproductive: No mass or other significant abnormality. Status post hysterectomy. Other:  No hernia or abnormal fluid collection is noted. Musculoskeletal: Multiple lytic lesions are seen in the pelvis and sacrum, consistent with metastatic disease. Largest lesion is seen posteriorly in right sacrum. IMPRESSION: Multiple lytic lesions are noted in the pelvis and sacrum, with the largest seen posteriorly in right sacrum. These are consistent with metastatic disease or possibly multiple myeloma. Wall thickening of distal sigmoid colon and rectum is noted with surrounding inflammation most consistent with  proctocolitis, although neoplasm cannot be excluded. Sigmoidoscopy is recommended for further evaluation. Left inguinal and  common iliac adenopathy is noted which may be inflammatory in etiology, but malignancy cannot be excluded. Wall thickening is seen involving the superior portion of urinary bladder; neoplasm cannot be excluded and cystoscopy is recommended. Electronically Signed   By: Marijo Conception, M.D.   On: 10/02/2018 17:06   US Renal  Result Date: 10/03/2018 CLINICAL DATA:  Decreased urine output. History of RIGHT nephrectomy. EXAM: RENAL / URINARY TRACT ULTRASOUND COMPLETE COMPARISON:  CT pelvis dated 10/02/2018. FINDINGS: Right Kidney: Surgically removed. Left Kidney: Renal measurements: 11.9 x 6.2 x 6.2 cm = volume: 240 mL. Mild to moderate hydronephrosis. LEFT renal cyst measures 2.4 cm. Bladder: Appears normal for degree of bladder distention. LEFT ureteral jet is visualized. IMPRESSION: 1. LEFT renal hydronephrosis, mild to moderate in degree. Suspect some degree of associated obstruction at the level of the bladder wall thickening as described on recent pelvis CT of 10/02/2018. However, a LEFT ureteral jet is visualized at the level of the bladder indicating patency of the LEFT UVJ. 2. Status post RIGHT nephrectomy. Electronically Signed   By: Franki Cabot M.D.   On: 10/03/2018 18:54   Dg Chest Port 1 View  Result Date: 10/04/2018 CLINICAL DATA:  84 year old female. Pleural effusion. Subsequent encounter. EXAM: PORTABLE CHEST 1 VIEW COMPARISON:  10/03/2018 chest CT and 10/02/2018 chest x-ray. FINDINGS: Rotation to the right. When compared to prior chest x-ray, progressive pulmonary vascular congestion/pulmonary edema. Increase in size of left-sided pleural effusion. Left base consolidation may represent atelectasis or infiltrate. Cardiomegaly. IMPRESSION: 1. Progressive pulmonary vascular congestion/pulmonary edema. 2. Increase in size of left-sided pleural effusion. Left base  consolidation may represent atelectasis or infiltrate. 3. Cardiomegaly. Electronically Signed   By: Genia Del M.D.   On: 10/04/2018 07:03    I independently reviewed the above imaging studies.  Impression/Assessment:  Solitary left kidney with left hydronephrosis.  Initially, she had low urinary output but the patient had a Foley catheter placed and has had a fairly brisk diuresis.  Creatinine overall has been fairly stable.  She does have bladder wall thickening in her dome.  She has an adjacent: Abnormality as well.  Her urinalysis on admission was without evidence of hematuria, so I doubt if there is urothelial carcinoma present.  Plan:  Since the patient does have other more pressing medical issues, I do not think she necessarily needs cystoscopy at this time.  If she improves, outpatient cystoscopy can be performed  For now, I would recommend leaving a Foley catheter in, especially with her being weak and having medical evaluation.  Once she is up and more ambulatory, voiding trial would be adequate  I will order an ultrasound to follow-up her left hydronephrosis, I would hope that this would improve with adequate bladder drainage.

## 2018-10-04 NOTE — Procedures (Signed)
Ultrasound-guided diagnostic and therapeutic left thoracentesis performed yielding 930 cc of slightly hazy, yellow  fluid. No immediate complications. Follow-up chest x-ray pending. The fluid was sent to the lab for preordered studies.

## 2018-10-05 ENCOUNTER — Inpatient Hospital Stay (HOSPITAL_COMMUNITY): Payer: Medicare Other

## 2018-10-05 DIAGNOSIS — C7951 Secondary malignant neoplasm of bone: Secondary | ICD-10-CM

## 2018-10-05 LAB — CBC WITH DIFFERENTIAL/PLATELET
ABS IMMATURE GRANULOCYTES: 0.38 10*3/uL — AB (ref 0.00–0.07)
BASOS ABS: 0.1 10*3/uL (ref 0.0–0.1)
Basophils Relative: 1 %
Eosinophils Absolute: 0.2 10*3/uL (ref 0.0–0.5)
Eosinophils Relative: 2 %
HEMATOCRIT: 32.4 % — AB (ref 36.0–46.0)
Hemoglobin: 9.8 g/dL — ABNORMAL LOW (ref 12.0–15.0)
IMMATURE GRANULOCYTES: 3 %
LYMPHS ABS: 0.8 10*3/uL (ref 0.7–4.0)
Lymphocytes Relative: 6 %
MCH: 25.8 pg — ABNORMAL LOW (ref 26.0–34.0)
MCHC: 30.2 g/dL (ref 30.0–36.0)
MCV: 85.3 fL (ref 80.0–100.0)
MONOS PCT: 6 %
Monocytes Absolute: 0.8 10*3/uL (ref 0.1–1.0)
NEUTROS ABS: 10.7 10*3/uL — AB (ref 1.7–7.7)
NEUTROS PCT: 82 %
NRBC: 0 % (ref 0.0–0.2)
PLATELETS: 320 10*3/uL (ref 150–400)
RBC: 3.8 MIL/uL — ABNORMAL LOW (ref 3.87–5.11)
RDW: 16.2 % — ABNORMAL HIGH (ref 11.5–15.5)
WBC: 13 10*3/uL — ABNORMAL HIGH (ref 4.0–10.5)

## 2018-10-05 LAB — PROTIME-INR
INR: 1.1
Prothrombin Time: 14.1 seconds (ref 11.4–15.2)

## 2018-10-05 LAB — PROTEIN ELECTROPHORESIS, SERUM
A/G RATIO SPE: 0.8 (ref 0.7–1.7)
ALPHA-1-GLOBULIN: 0.3 g/dL (ref 0.0–0.4)
ALPHA-2-GLOBULIN: 1.1 g/dL — AB (ref 0.4–1.0)
Albumin ELP: 2.8 g/dL — ABNORMAL LOW (ref 2.9–4.4)
BETA GLOBULIN: 0.9 g/dL (ref 0.7–1.3)
Gamma Globulin: 1 g/dL (ref 0.4–1.8)
Globulin, Total: 3.3 g/dL (ref 2.2–3.9)
Total Protein ELP: 6.1 g/dL (ref 6.0–8.5)

## 2018-10-05 LAB — COMPREHENSIVE METABOLIC PANEL
ALBUMIN: 2.4 g/dL — AB (ref 3.5–5.0)
ALT: 26 U/L (ref 0–44)
AST: 63 U/L — ABNORMAL HIGH (ref 15–41)
Alkaline Phosphatase: 93 U/L (ref 38–126)
Anion gap: 10 (ref 5–15)
BUN: 30 mg/dL — ABNORMAL HIGH (ref 8–23)
CHLORIDE: 100 mmol/L (ref 98–111)
CO2: 22 mmol/L (ref 22–32)
CREATININE: 1.12 mg/dL — AB (ref 0.44–1.00)
Calcium: 10.6 mg/dL — ABNORMAL HIGH (ref 8.9–10.3)
GFR calc Af Amer: 50 mL/min — ABNORMAL LOW (ref >60)
GFR, EST NON AFRICAN AMERICAN: 43 mL/min — AB (ref >60)
GLUCOSE: 68 mg/dL — AB (ref 70–99)
POTASSIUM: 3.6 mmol/L (ref 3.5–5.1)
Sodium: 132 mmol/L — ABNORMAL LOW (ref 135–145)
Total Bilirubin: 0.5 mg/dL (ref 0.3–1.2)
Total Protein: 5.6 g/dL — ABNORMAL LOW (ref 6.5–8.1)

## 2018-10-05 LAB — MAGNESIUM: MAGNESIUM: 1.7 mg/dL (ref 1.7–2.4)

## 2018-10-05 LAB — ACID FAST SMEAR (AFB): ACID FAST SMEAR - AFSCU2: NEGATIVE

## 2018-10-05 LAB — CALCIUM, IONIZED: Calcium, Ionized, Serum: 7.1 mg/dL — ABNORMAL HIGH (ref 4.5–5.6)

## 2018-10-05 LAB — PHOSPHORUS: Phosphorus: 2.8 mg/dL (ref 2.5–4.6)

## 2018-10-05 MED ORDER — LIDOCAINE HCL (PF) 1 % IJ SOLN
INTRAMUSCULAR | Status: AC | PRN
  Administered 2018-10-05: 5 mL

## 2018-10-05 MED ORDER — POLYVINYL ALCOHOL 1.4 % OP SOLN
1.0000 [drp] | OPHTHALMIC | Status: DC | PRN
  Administered 2018-10-05 – 2018-10-06 (×3): 1 [drp] via OPHTHALMIC
  Filled 2018-10-05: qty 15

## 2018-10-05 MED ORDER — FENTANYL CITRATE (PF) 100 MCG/2ML IJ SOLN
INTRAMUSCULAR | Status: AC
  Filled 2018-10-05: qty 2

## 2018-10-05 MED ORDER — GERHARDT'S BUTT CREAM
TOPICAL_CREAM | Freq: Three times a day (TID) | CUTANEOUS | Status: DC
  Administered 2018-10-05 – 2018-10-08 (×9): via TOPICAL
  Administered 2018-10-09: 1 via TOPICAL
  Filled 2018-10-05 (×2): qty 1

## 2018-10-05 MED ORDER — MIDAZOLAM HCL 2 MG/2ML IJ SOLN
INTRAMUSCULAR | Status: AC | PRN
  Administered 2018-10-05: 0.5 mg via INTRAVENOUS

## 2018-10-05 MED ORDER — MAGNESIUM SULFATE 2 GM/50ML IV SOLN
2.0000 g | Freq: Once | INTRAVENOUS | Status: AC
  Administered 2018-10-05: 2 g via INTRAVENOUS
  Filled 2018-10-05: qty 50

## 2018-10-05 MED ORDER — PREDNISONE 10 MG PO TABS
10.0000 mg | ORAL_TABLET | Freq: Two times a day (BID) | ORAL | Status: DC
  Administered 2018-10-05 – 2018-10-09 (×9): 10 mg via ORAL
  Filled 2018-10-05 (×9): qty 1

## 2018-10-05 MED ORDER — FENTANYL CITRATE (PF) 100 MCG/2ML IJ SOLN
INTRAMUSCULAR | Status: AC | PRN
  Administered 2018-10-05: 25 ug via INTRAVENOUS

## 2018-10-05 MED ORDER — MIDAZOLAM HCL 2 MG/2ML IJ SOLN
INTRAMUSCULAR | Status: AC
  Filled 2018-10-05: qty 2

## 2018-10-05 NOTE — Progress Notes (Signed)
MEDICATION-RELATED CONSULT NOTE   IR Procedure Consult - Anticoagulant/Antiplatelet PTA/Inpatient Med List Review by Pharmacist    Procedure:  US guided biopsy of left subpectoral lymph node. Mx 18g core biopsy.     Completed: 11/12 12:10 PM  Post-Procedural bleeding risk per IR MD assessment:  Low  Antithrombotic medications on inpatient or PTA profile prior to procedure:   LMWH 30 qday    Recommended restart time per IR Post-Procedure Guidelines:  Day 0   Plan:    Resume LMWH 30 qhs tonight as ordered  Eudelia Bunch, Pharm.D [phone] 10/05/2018 12:23 PM

## 2018-10-05 NOTE — Progress Notes (Signed)
PROGRESS NOTE    [name]  BEM:[id number] DOB: 12/13/1928 DOA: 10/02/2018 PCP: Janie Morning, DO  Brief Narrative:  HPI per Dr. Shela Leff on 10/02/18 [name] is a 84 y.o. female with medical history significant of h/o skin cancer, h/o breast cancer, hypertension, hyperlipidemia presenting to the hospital for evaluation of altered mental status per family.  Daughter at bedside states patient seemed confused earlier today and was not able to pick up a bowl to feed herself.  She did not notice any slurring of speech, facial droop, or focal weakness.  States patient has been complaining of left-sided sacral pain for the past 2 weeks.  She does have a history of chronic back pain due to compression fractures.  Patient has been feeling nauseous but has not vomited.  She denies having any abdominal pain.  States she took oxycodone this morning for her chronic back pain but has taken this medication before.  ED Course: Hemodynamically stable.  White count 12.9.  Hemoglobin 10.5 with normal MCV, no recent baseline.  Corrected calcium 14.9.  Chest x-ray showing mild left pleural effusion with probable underlying atelectasis or infiltrate and minimal right pleural effusion.  CT head negative for acute intracranial process.  CT pelvis showing multiple lytic lesions in the pelvis and sacrum, with the largest seen posteriorly in the right sacrum.  These are thought to be consistent with metastatic disease or possibly multiple myeloma.  CT also showing wall thickening of distal sigmoid colon and rectum with surrounding inflammation most consistent with proctocolitis although neoplasm cannot be excluded.  There is left inguinal and common iliac adenopathy, malignancy cannot be excluded.  There is wall thickening in the superior portion of the urinary bladder, neoplasm cannot be excluded.  Patient received 1 L normal saline bolus in the ED.  TRH paged to admit.  **Calcium level is improving and  oncology evaluated patient and ordered a CT of the soft tissue of the neck.  Patient made minimal urine during yesterday;s shift so was given 2 boluses and currently obtaining a renal ultrasound.  Renal ultrasound showed left hydronephrosis so Foley catheter was placed and urology was consulted.  Urology recommends continuing Foley catheter drainage and repeating a renal ultrasound.  Palliative care was called for goals of care.  Overnight on 11/10-11/11 the patient became a little bit dyspneic so IV fluids were stopped and a chest x-ray revealed that she had some volume overload and pulmonary edema.  ECHOcardiogram obtaiend and BNP was slightly elevated, so patient may have some evidence of heart failure.  IV fluids were discontinued and she was given a dose of IV Lasix.  Patient underwent thoracentesis 10/04/18 and under went U/S Guided biopsy of the left subpectoral lymph node.   Assessment & Plan:   Principal Problem:   Hypercalcemia Active Problems:   Acute metabolic encephalopathy   Anemia   Leukocytosis   Hyponatremia   Abnormal LFTs   Hypertension  Hypercalcemia in the setting of suspected malignancy, improving  -Either a recurrence of metastatic breast cancer with multiple myeloma -Corrected calcium on admission was 14.9 -Patient was awake and alert and answer questions appropriately but family states she is very confused and has no history of dementia.  Had no other complaints. -CT abdomen and pelvis showed multiple lytic lesions in the pelvis and sacrum with a large seen posterior to the right sacrum.  These are thought to be consistent with metastatic disease and possibly multiple myeloma.  The CT also showed thickening  of the distal sigmoid colon and rectum with surrounding inflammation most consistent with proctocolitis, neoplasm could not be excluded and there is also left inguinal, iliac adenopathy.  Malignancy cannot be excluded as well there is also bladder wall thickening in  the superior portion of the urinary bladder which neoplasm could not be excluded. -Recommendations were for a sigmoidoscopy as well as a cystoscopy -I discussed the case with urology Dr. Noah Delaine who recommended obtaining a renal ultrasound currently as the patient has minimal urine output to evaluate for hydronephrosis and if the patient does have hydronephrosis recommends placing Foley catheter and to call him back; Foley catheter palced and Urology Dr. Luberta Robertson consulted for formal consultation  -Patient received 1 L normal Saline in the ED and was also given IV Zoledronic acid 4 mg for severe hypercalcemia -Creatinine on admission was 0.97 and slightly worsened to 1.04 -> 1.13 -Patient was given normal saline at 200 and mils per hour however subsequently fell off more and was reinitiated 200 and mils per hour -Continue monitor strict I's and O's and urine output -Patient given tramadol 50 mg p.o. every 6 as needed for pain as well as acetaminophen -Check ionized calcium level and was 7.1 -Continue to monitor and repeat CMP -Ca2+ trending down and was 10.6 this AM  -Patient has had multiple studies sent off including LDH, beta-2 microglobulin, CRP, serum kappa and lambda light chains (kappa free light chains 57.5 and lambda free light chain of 34.8, kappa lambda light chain ratio 1.65), she is also had SPEP with immunofixation as well as UPEP with immunofixation -Dr. Benay Spice of Oncology evaluated and initially recommended continuing IV fluid hydration with normal saline and repeat a CMP in a.m. IVF discontinued due to Volume overload -Dr. Benay Spice also recommended CT of the neck and chest and a diagnostic biopsy by interventional radiology if a neck mass was confirmed; Diagnostic Biopsy as below -CT of the chest showed enlarged left subpectoral lymph node and there is also prominent left axillary enlarged right axillary adenopathy consistent with metastatic disease.  There is also lytic lesions  involving the thoracic spine and ribs concerning for osseous metastatic disease and what appeared to be a pathological T4 vertebral fracture.  There is also moderate left and a small right pleural effusion noted on CT scan and there was underlying consolidation with adjacent pulmonary parenchyma which may represent atelectasis or infection. -Will have IR biopsy node for Diagnosis this was done 10/05/2018 -CRP was 3.5 and procalcitonin level was less than 0.10 making infection less likely and more likely atelectasis -Dr. Benay Spice of Oncology starting Prednisone today for pain and annorexia and will make a decision about a trial of systemic therapy vs. Comfort care based on the biopsy result -Palliative Care Consulted for [place] Discussion -PT to evaluate and Treat   Minimal urine output/poor urine output in the setting of Left Hydronephrosis from ?Bladder Obstruction -CT Scan of pelvis showed wall thickening involving the superior portion of the urinary bladder and could not exclude a neoplasm and recommended cystoscopy -Continue with normal saline at rate of 100 and mils per hour -Given two 500 mL boluses -Obtain renal ultrasound which showed left-sided hydronephrosis with mild to moderate in degree and suspected some degree of associated obstruction at the level of the bladder wall thickening.  Patient had a left ureteral jet was visualized at the level of the bladder indicating patency of the left UVJ there is status post right nephrectomy -Placed a Foley catheter and discussed with Dr.  Dahlstedt of Urology.  She has other underlying pressing medical issues Dr. Diona Fanti does not necessarily think she needs a cystoscopy at this time recommend leaving the Foley catheter and doing a voiding trial when she is up and more amatory.  He is ordering an ultrasound for follow-up of her left hydronephrosis and that would hope to improve her adequate bladder drainage -Repeat U/S 10/05/18 showed Mild to moderate  left hydronephrosis. Similar findings noted on prior exam.Prior right nephrectomy.  -Will need to follow up further Urology Recommendations by Dr. Diona Fanti   Acute Metabolic Encephalopathy -Likely related to severe hypercalcemia.   -Patient uses oxycodone at home for chronic back pain which could also possibly be contributing. - At present, she is awake, alert, and oriented to person place.   -Answering questions appropriately.  No focal neuro deficits.  -CT head negative for acute intracranial process.   -Management of hypercalcemia as above -Oncology Consulted and appreciate further reccs  Normocytic Anemia -Hemoglobin 10.5 with normal MCV, no recent baseline.   -In the setting of suspected multiple myeloma/malignancy. -Hb/Hct went from 10.5/34.0 -> 10.1/32.0 -> 9.5/31.5 -> 9.8/32.4 -Check Anemia Panel in the AM  -Continue to Monitor for S/Sx of Bleeding -Repeat CMP in AM   Mild Leukocytosis -White count 12.9 intially trended down to 11.4 but now back up to 14.0 -> 13.0 -? Leukocytosis related to Pain -Chest x-ray showing mild left pleural effusion with probable underlying atelectasis or infiltrate and minimal right pleural effusion.    However CT scan showed a moderate layering pleural effusion -CT with evidence of possible proctocolitis. Patient is not complaining of any abdominal pain.  Abdominal exam benign.   -UA not indicative of infection and urine cultures pending -Pneumonia less likely as patient is afebrile and has no respiratory complaints.  Satting well on room air. Appears nontoxic on exam. -Repeat CBC in a.m. -Check procalcitonin level and was less than 0.10 -Likely in the setting of malignancy rather than infection  Mild Hyponatremia -Sodium 132. -Continuef fluid resuscitation for severe hypercalcemia but D/C'd due to Pulmonary edema and -Repeat labs in the morning  Abnormal LFTs -AST 69, remainder of LFTs normal and trended down to 63 -No recent baseline  in the chart.  Patient is not complaining of any abdominal pain.   -She is nauseous, which could be explained by severe hypercalcemia.  No episodes of vomiting. -May need an Abdominal U/S -Repeat CMP in AM  Hypertension -Hold home diuretic/ARB receiving IVF hydration but now stopped  -IV hydralazine 5 mg every 4 hours as needed for SBP>160  Left Pleural Effusion s/p Thoracentesis  -X-ray this morning showed increasing size of left-sided pleural effusion with left base consolidation and may represent atelectasis or infiltrate -Will get IR to do a Thoracentesis  -Had it done today and yielded 930 mL of Slightly Hazy Yellow Fluid -Labs sent for Analysis  -Repeat CXR showed improvement   Multiple Thyroid Nodules -? Benign Goiter -Consider Thyroid U/S if lymph nodes not biopsied -Check TSH and Free T4  Pathologic T4 Fracture -C/w Pain Control with Hydrocodone-Acetaminophen -Patient being started on Prednisone 10 mg po BID by Oncology    Acute Pulmonary Edema -? Cardiac Etiology as there is progressive pulmonary vascular congestion pulmonary edema with cardiomegaly -Stop IVF Hydration and give a dose of Lasix -Strict I's and O's and daily weights -Check BNP and was 243.6 -Patient is +1.299 Liters since admission  -Continue to Monitor Strict I's/O's; Daily Weights   Skin Tear on Buttocks/MASD -WOC  nurse consulted  -Recommending Mattress replacement and Bilateral Prevalon pressure redistribution heel boots -Continue POC for side to side repositioning in bed Gerhardt's Butt Cream applied TID   DVT prophylaxis: Enoxaparin sq 24h Code Status: DO NOT RESUSCITATE Family Communication: Discussed with Husband and Daughter at bedside  Disposition Plan: Remain Inpatient for current   Consultants:   Medical Oncology  Interventional radiology  Palliative care medicine  Urology   Procedures: None   Antimicrobials:  Anti-infectives (From admission, onward)   None      Subjective: Seen and examined and states she did not feel well.  Had complaints of pain.  No chest pain, lightheadedness or dizziness.  But had whole body pain.  Understands that she is to go for further testing today and will get a biopsy.  No other concerns or complaints at this time and answered family's questions to their satisfaction.  Objective: Vitals:   10/05/18 0956 10/05/18 1155 10/05/18 1210 10/05/18 1450  BP:  (!) 127/45 (!) 98/44 (!) 136/50  Pulse:  99 96 (!) 101  Resp:  '16 12 14  '$ Temp:    (!) 97.4 F (36.3 C)  TempSrc:    Oral  SpO2: 92% 98% 96% 91%  Weight:      Height:        Intake/Output Summary (Last 24 hours) at 10/05/2018 1603 Last data filed at 10/04/2018 1700 Gross per 24 hour  Intake 0 ml  Output -  Net 0 ml   Filed Weights   10/02/18 1411 10/03/18 0551  Weight: 53.5 kg 53.5 kg   Examination: Physical Exam:  Constitutional: Patient is a well-nourished, well-developed ill-appearing Caucasian female currently no acute distress appears calm but very uncomfortable Eyes: Lids and conjunctive are normal. ENMT: External ears and nose appear normal.  Grossly normal hearing. Neck: Appears supple with no appreciable JVD.  Has a small neck mass in the left lower neck. Respiratory: Diminished to auscultation bilaterally with some mild crackles and coarse breath sounds again today.  Did not improve Cardiovascular: Regular rate and rhythm.  Has no appreciable murmurs, rubs or gallops.  No lower extremity edema noted Abdomen: Abdomen is soft, nontender, nondistended.  Bowel sounds present GU: Deferred but has a Foley catheter in place and draining yellow color urine Musculoskeletal: No contractures or cyanosis.  No joint deformities were noted Skin: Skin is warm and dry no appreciable rashes or lesions limited skin evaluation Neurologic: Cranial nerves II through XII grossly intact no appreciable focal deficit Psychiatric: Appeared anxious and slightly  distressed today.  Normal judgment intact.  Patient is awake and alert and oriented x2  Data Reviewed: I have personally reviewed following labs and imaging studies  CBC: Recent Labs  Lab 10/02/18 1529 10/03/18 0542 10/04/18 0549 10/05/18 0624  WBC 12.9* 11.4* 14.0* 13.0*  NEUTROABS 9.9*  --  11.7* 10.7*  HGB 10.5* 10.1* 9.5* 9.8*  HCT 34.0* 32.0* 31.5* 32.4*  MCV 84.8 84.4 85.1 85.3  PLT 323 320 300 185   Basic Metabolic Panel: Recent Labs  Lab 10/02/18 1529 10/03/18 0542 10/03/18 1606 10/04/18 0549 10/05/18 0624  NA 132* 132* 132* 132* 132*  K 3.6 3.5 3.5 3.8 3.6  CL 91* 96* 98 101 100  CO2 '31 27 26 22 22  '$ GLUCOSE 115* 108* 96 85 68*  BUN 27* 25* 27* 26* 30*  CREATININE 0.97 0.97 1.04* 1.13* 1.12*  CALCIUM 14.3* 13.3* 12.6* 11.5* 10.6*  MG  --   --   --  1.7 1.7  PHOS  --   --   --  3.1 2.8   GFR: Estimated Creatinine Clearance: 29.3 mL/min (A) (by C-G formula based on SCr of 1.12 mg/dL (H)). Liver Function Tests: Recent Labs  Lab 10/02/18 1529 10/03/18 0542 10/04/18 0549 10/05/18 0624  AST 69* 64* 62* 63*  ALT '24 24 28 26  '$ ALKPHOS 124 104 93 93  BILITOT 0.8 0.6 0.5 0.5  PROT 7.0 5.9* 5.3* 5.6*  ALBUMIN 3.2* 2.6* 2.3* 2.4*   No results for input(s): LIPASE, AMYLASE in the last 168 hours. No results for input(s): AMMONIA in the last 168 hours. Coagulation Profile: Recent Labs  Lab 10/05/18 0624  INR 1.10   Cardiac Enzymes: No results for input(s): CKTOTAL, CKMB, CKMBINDEX, TROPONINI in the last 168 hours. BNP (last 3 results) No results for input(s): PROBNP in the last 8760 hours. HbA1C: No results for input(s): HGBA1C in the last 72 hours. CBG: No results for input(s): GLUCAP in the last 168 hours. Lipid Profile: No results for input(s): CHOL, HDL, LDLCALC, TRIG, CHOLHDL, LDLDIRECT in the last 72 hours. Thyroid Function Tests: No results for input(s): TSH, T4TOTAL, FREET4, T3FREE, THYROIDAB in the last 72 hours. Anemia Panel: No results for  input(s): VITAMINB12, FOLATE, FERRITIN, TIBC, IRON, RETICCTPCT in the last 72 hours. Sepsis Labs: Recent Labs  Lab 10/02/18 2231  PROCALCITON <0.10    Recent Results (from the past 240 hour(s))  Urine Culture     Status: None   Collection Time: 10/02/18  7:20 PM  Result Value Ref Range Status   Specimen Description   Final    URINE, CLEAN CATCH Performed at [facility], [place] [address], [place], [place] [address]    Special Requests   Final    NONE Performed at [facility], [place] [address], [place], [place] [address]    Culture   Final    NO GROWTH Performed at [facility], [place] [address], [place], [place] [address]    Report Status 10/04/2018 FINAL  Final  Acid Fast Smear (AFB)     Status: None   Collection Time: 10/04/18  1:01 PM  Result Value Ref Range Status   AFB Specimen Processing Concentration  Final   Acid Fast Smear Negative  Final    Comment: (NOTE) Performed At: [facility] [place], [place] [id number] Rush Farmer MD RK:[id number]    Source (AFB) PLEURAL  Final    Comment: LEFT Performed at [facility], [place] [address], [place], [place] [address]   Culture, body fluid-bottle     Status: None (Preliminary result)   Collection Time: 10/04/18  1:01 PM  Result Value Ref Range Status   Specimen Description PLEURAL  Final   Special Requests NONE  Final   Culture   Final    NO GROWTH < 24 HOURS Performed at [facility], [place] [address], [place], [place] [address]    Report Status PENDING  Incomplete  Gram stain     Status: None   Collection Time: 10/04/18  1:01 PM  Result Value Ref Range Status   Specimen Description PLEURAL  Final   Special Requests NONE  Final   Gram Stain   Final    WBC PRESENT,BOTH PMN AND MONONUCLEAR NO ORGANISMS SEEN CYTOSPIN SMEAR Performed at [facility], [address] [place], [place] [address]    Report Status 10/04/2018  FINAL  Final    Radiology Studies: Dg Chest  1 View  Result Date: 10/04/2018 CLINICAL DATA:  Status post left thoracentesis. EXAM: CHEST  1 VIEW COMPARISON:  Radiograph of October 04, 2018. FINDINGS: Stable cardiomediastinal silhouette. No pneumothorax is noted. Left pleural effusion noted on prior exam is significantly decreased. Mild right basilar atelectasis and effusion is noted. Residual left basilar subsegmental atelectasis is noted. Bony thorax is unremarkable. IMPRESSION: Significantly decreased left pleural effusion status post thoracentesis. No pneumothorax is noted. Electronically Signed   By: Marijo Conception, M.D.   On: 10/04/2018 12:40   US Renal  Result Date: 10/05/2018 CLINICAL DATA:  Hydronephrosis. EXAM: RENAL / URINARY TRACT ULTRASOUND COMPLETE COMPARISON:  Ultrasound 10/04/2018. FINDINGS: Right Kidney: Right nephrectomy. Left Kidney: Renal measurements: 12.1 x 6.0 x 6.7 cm = volume: 254.2 cc mL. Echogenicity within normal limits. Mild hydronephrosis. 2.2 cm simple cyst. Bladder: Foley catheter noted bladder.  Bladder is nondistended. Bilateral pleural effusions noted.  Ascites noted. IMPRESSION: 1. Mild to moderate left hydronephrosis. Similar findings noted on prior exam. 2.  Prior right nephrectomy. Electronically Signed   By: Marcello Moores  Register   On: 10/05/2018 13:40   US Renal  Result Date: 10/03/2018 CLINICAL DATA:  Decreased urine output. History of RIGHT nephrectomy. EXAM: RENAL / URINARY TRACT ULTRASOUND COMPLETE COMPARISON:  CT pelvis dated 10/02/2018. FINDINGS: Right Kidney: Surgically removed. Left Kidney: Renal measurements: 11.9 x 6.2 x 6.2 cm = volume: 240 mL. Mild to moderate hydronephrosis. LEFT renal cyst measures 2.4 cm. Bladder: Appears normal for degree of bladder distention. LEFT ureteral jet is visualized. IMPRESSION: 1. LEFT renal hydronephrosis, mild to moderate in degree. Suspect some degree of associated obstruction at the level of the bladder wall  thickening as described on recent pelvis CT of 10/02/2018. However, a LEFT ureteral jet is visualized at the level of the bladder indicating patency of the LEFT UVJ. 2. Status post RIGHT nephrectomy. Electronically Signed   By: Franki Cabot M.D.   On: 10/03/2018 18:54   Dg Chest Port 1 View  Result Date: 10/05/2018 CLINICAL DATA:  Shortness of breath EXAM: PORTABLE CHEST 1 VIEW COMPARISON:  10/04/2018 FINDINGS: Cardiac shadow is stable. The lungs are well aerated bilaterally. Small bilateral pleural effusions are again seen and stable. Left basilar atelectatic changes are noted slightly greater than that seen on the prior exam. No pneumothorax is noted. IMPRESSION: Slight increase in left basilar atelectasis. Persistent small pleural effusions. Electronically Signed   By: Inez Catalina M.D.   On: 10/05/2018 08:26   Dg Chest Port 1 View  Result Date: 10/04/2018 CLINICAL DATA:  84 year old female. Pleural effusion. Subsequent encounter. EXAM: PORTABLE CHEST 1 VIEW COMPARISON:  10/03/2018 chest CT and 10/02/2018 chest x-ray. FINDINGS: Rotation to the right. When compared to prior chest x-ray, progressive pulmonary vascular congestion/pulmonary edema. Increase in size of left-sided pleural effusion. Left base consolidation may represent atelectasis or infiltrate. Cardiomegaly. IMPRESSION: 1. Progressive pulmonary vascular congestion/pulmonary edema. 2. Increase in size of left-sided pleural effusion. Left base consolidation may represent atelectasis or infiltrate. 3. Cardiomegaly. Electronically Signed   By: Genia Del M.D.   On: 10/04/2018 07:03   [place] Core Biopsy (lymph Nodes)  Result Date: 10/05/2018 INDICATION: 84 year old female with a history of left breast carcinoma. Likely metastatic/recurrent disease to the lymph nodes. She presents for biopsy EXAM: ULTRASOUND GUIDED BIOPSY SUBPECTORAL LYMPH NODE MEDICATIONS: None. ANESTHESIA/SEDATION: Moderate (conscious) sedation was employed during this  procedure. A total of Versed 0.5 mg and Fentanyl 25 mcg was administered intravenously. Moderate Sedation Time: 10 minutes. The patient's level  of consciousness and vital signs were monitored continuously by radiology nursing throughout the procedure under my direct supervision. FLUOROSCOPY TIME:  None COMPLICATIONS: None PROCEDURE: Informed written consent was obtained from the patient after a thorough discussion of the procedural risks, benefits and alternatives. All questions were addressed. Maximal Sterile Barrier Technique was utilized including caps, mask, sterile gowns, sterile gloves, sterile drape, hand hygiene and skin antiseptic. A timeout was performed prior to the initiation of the procedure. Patient positioned supine position on the ultrasound table. Images of the left chest were performed with images stored sent to PACs. The patient is prepped and draped in the usual sterile fashion. 1% lidocaine was used for local anesthesia. Using ultrasound guidance, guide needle was advanced into the subpectoral pathologic lymph node. Multiple 18 gauge core biopsy were performed. Needles removed. Final image was stored. Patient tolerated the procedure well and remained hemodynamically stable throughout. No complications were encountered and no significant blood loss. IMPRESSION: Status post ultrasound-guided biopsy of left subpectoral lymph node. Tissue specimen sent to pathology for complete histopathologic analysis. Signed, Dulcy Fanny. Dellia Nims, RPVI [facility] Electronically Signed   By: Corrie Mckusick D.O.   On: 10/05/2018 12:59   US Thoracentesis Asp Pleural Space W/img Guide  Result Date: 10/04/2018 INDICATION: Patient with remote history of breast cancer, lytic bony lesions, adenopathy, bilateral pleural effusions; request received for diagnostic and therapeutic left thoracentesis. EXAM: ULTRASOUND GUIDED DIAGNOSTIC AND THERAPEUTIC LEFT  THORACENTESIS MEDICATIONS: None COMPLICATIONS: None immediate. PROCEDURE: An ultrasound guided thoracentesis was thoroughly discussed with the patient/spouse/daughter and questions answered. The benefits, risks, alternatives and complications were also discussed. The patient understands and wishes to proceed with the procedure. Written consent was obtained. Ultrasound was performed to localize and mark an adequate pocket of fluid in the left chest. The area was then prepped and draped in the normal sterile fashion. 1% Lidocaine was used for local anesthesia. Under ultrasound guidance a 6 Fr Safe-T-Centesis catheter was introduced. Thoracentesis was performed. The catheter was removed and a dressing applied. FINDINGS: A total of approximately 930 cc of slightly hazy, yellow fluid was removed. Samples were sent to the laboratory as requested by the clinical team. IMPRESSION: Successful ultrasound guided diagnostic and therapeutic left thoracentesis yielding 930 cc of pleural fluid. Read by: Rowe Robert, PA-C Electronically Signed   By: Markus Daft M.D.   On: 10/04/2018 13:08   Scheduled Meds: . aspirin EC  81 mg Oral Daily  . enoxaparin (LOVENOX) injection  30 mg Subcutaneous QHS  . fentaNYL      . Gerhardt's butt cream   Topical TID  . ipratropium-albuterol  3 mL Nebulization BID  . midazolam      . predniSONE  10 mg Oral BID   Continuous Infusions:   LOS: 3 days   Kerney Elbe, DO [facility] PAGER is on AMION  If 7PM-7AM, please contact night-coverage [email or web address] Password [facility] 10/05/2018, 4:03 PM

## 2018-10-05 NOTE — Procedures (Signed)
Interventional Radiology Procedure Note  Procedure: US guided biopsy of left subpectoral lymph node. Mx 18g core biopsy.  Complications: None Recommendations:  - follow up pathology - Do not submerge for 7 days - Routine wound care   Signed,  Dulcy Fanny. Earleen Newport, DO

## 2018-10-05 NOTE — Consult Note (Signed)
Consultation Note Date: 10/05/2018   Patient Name: [name]  DOB: Oct 11, 1929  MRN: [id number]  Age / Sex: 84 y.o., female  PCP: Janie Morning, DO Referring Physician: Kerney Elbe, DO  Reason for Consultation: Establishing goals of care  HPI/Patient Profile: 84 y.o. female    admitted on 10/02/2018    Clinical Assessment and Goals of Care:  [name] a 84 y.o.femalewith medical history significant ofh/oskin cancer,h/obreast cancer, hypertension, hyperlipidemia presenting to the hospital for evaluation of altered mental status, she has been admitted for hypercalcemia, in the setting of suspected malignancy. CT abdomen and pelvis with multiple lytic lesions in pelvis and sacrum, also known to have thickening of distal sigmoid colon and rectum with surrounding inflammation most consistent with proctocolitis, neoplasm could not be excluded and there is also left inguinal, iliac adenopathy.  Malignancy cannot be excluded as well there is also bladder wall thickening in the superior portion of the urinary bladder which neoplasm could not be excluded.  Med onc is following, patient underwent biopsy of L sub pectoral lymph node, results pending. She also had bilateral pleural effusions.  She is status post left thoracentesis  yielding approximately 1 L fluid, pathology results pending.   A palliative consult has been requested for early goals of care discussions.   The patient appears frail and weak, she is complaining of back pain, she is complaining of pain in her legs. I met with her husband and daughter at bedside. I introduced myself and palliative care as follows: Palliative medicine is specialized medical care for people living with serious illness. It focuses on providing relief from the symptoms and stress of a serious illness. The goal is to improve quality of life for both the  patient and the family.  Briefly discussed about goals, wishes and values. At this time, awaiting pathology results and further direction of care, from a med onc perspective. We discussed about pain and non pain symptom management.   See additional recommendations below. Thank you for the consult.   NEXT OF KIN  husband 2 daughters  SUMMARY OF RECOMMENDATIONS    heating pad for back pain Continue current pain and non pain symptom management medications Recent doppler U/S negative for clot, patient has pain in both legs. On Mag replacement currently.  Await pathology results, appreciate med onc input. PMT to follow along.   Code Status/Advance Care Planning:  DNR    Symptom Management:    as above   Palliative Prophylaxis:   Bowel Regimen   Psycho-social/Spiritual:   Desire for further Chaplaincy support:yes  Additional Recommendations: Caregiving  Support/Resources  Prognosis:   Unable to determine  Discharge Planning: To Be Determined      Primary Diagnoses: Present on Admission: . Hypercalcemia   I have reviewed the medical record, interviewed the patient and family, and examined the patient. The following aspects are pertinent.  Past Medical History:  Diagnosis Date  . Barrett esophagus   . Hx of skin cancer, basal cell   . HX:  breast cancer   . Hyperlipemia   . Hypertension   . IBS (irritable bowel syndrome)   . Neck pain   . Osteoarthritis   . Osteoporosis   . Prediabetes    Social History   Socioeconomic History  . Marital status: Married    Spouse name: Not on file  . Number of children: 2  . Years of education: 55  . Highest education level: Not on file  Occupational History  . Occupation: Retired  Scientific laboratory technician  . Financial resource strain: Not on file  . Food insecurity:    Worry: Not on file    Inability: Not on file  . Transportation needs:    Medical: Not on file    Non-medical: Not on file  Tobacco Use  . Smoking status:  Former Smoker    Types: Cigarettes  . Smokeless tobacco: Never Used  . Tobacco comment: Quit 1970  Substance and Sexual Activity  . Alcohol use: Yes    Alcohol/week: 0.0 standard drinks    Comment: Occasional glass of wine  . Drug use: No  . Sexual activity: Not on file  Lifestyle  . Physical activity:    Days per week: Not on file    Minutes per session: Not on file  . Stress: Not on file  Relationships  . Social connections:    Talks on phone: Not on file    Gets together: Not on file    Attends religious service: Not on file    Active member of club or organization: Not on file    Attends meetings of clubs or organizations: Not on file    Relationship status: Not on file  Other Topics Concern  . Not on file  Social History Narrative   Lives at home with husband.   Right-handed.   2-3 cups caffeine per day.   Family History  Problem Relation Age of Onset  . Congestive Heart Failure Mother   . Pneumonia Father   . Dementia Father    Scheduled Meds: . aspirin EC  81 mg Oral Daily  . enoxaparin (LOVENOX) injection  30 mg Subcutaneous QHS  . fentaNYL      . Gerhardt's butt cream   Topical TID  . ipratropium-albuterol  3 mL Nebulization BID  . midazolam      . predniSONE  10 mg Oral BID   Continuous Infusions: . magnesium sulfate 1 - 4 g bolus IVPB 2 g (10/05/18 1349)   PRN Meds:.acetaminophen **OR** acetaminophen, hydrALAZINE, HYDROcodone-acetaminophen, ondansetron (ZOFRAN) IV, polyethylene glycol, senna, traMADol Medications Prior to Admission:  Prior to Admission medications   Medication Sig Start Date End Date Taking? Authorizing Provider  Ascorbic Acid (VITAMIN C) 1000 MG tablet Take 1,000 mg by mouth daily.   Yes [provider]  aspirin EC 81 MG tablet Take 81 mg by mouth daily.   Yes [provider]  Biotin (BIOTIN MAXIMUM STRENGTH) 10 MG TABS Take by mouth daily.   Yes [provider]  Cholecalciferol (VITAMIN D-3 PO) Take by  mouth daily.   Yes [provider]  hydrochlorothiazide (HYDRODIURIL) 12.5 MG tablet Take 12.5 mg by mouth daily.   Yes [provider]  losartan-hydrochlorothiazide (HYZAAR) 100-12.5 MG tablet Take 1 tablet by mouth daily. 08/06/18  Yes [provider]  Magnesium 400 MG CAPS Take by mouth daily.   Yes [provider]  Omega-3 Fatty Acids (FISH OIL) 1000 MG CAPS Take by mouth daily.   Yes [provider]  oxyCODONE-acetaminophen (PERCOCET) 5-325 MG tablet Take 1 tablet by mouth every 8 (eight) hours as needed for severe pain. 08/28/15  Yes Marcial Pacas, MD  TURMERIC PO Take by mouth daily.   Yes [provider]   No Known Allergies Review of Systems Back pain  Physical Exam Frail appearing lady Shallow clear breath sounds Pain in both legs No edema Awake alert S 1 S 2 Abdomen non tender  Vital Signs: BP (!) 98/44 (BP Location: Left Arm)   Pulse 96   Temp 98.5 F (36.9 C) (Oral)   Resp 12   Ht _0  (1.6 m)   Wt 53.5 kg   SpO2 96%   BMI 20.89 kg/m  Pain Scale: Faces POSS *See Group Information*: S-Acceptable,Sleep, easy to arouse Pain Score: 0-No pain   SpO2: SpO2: 96 % O2 Device:SpO2: 96 % O2 Flow Rate: .O2 Flow Rate (L/min): 2 L/min  IO: Intake/output summary:   Intake/Output Summary (Last 24 hours) at 10/05/2018 1415 Last data filed at 10/04/2018 1700 Gross per 24 hour  Intake 0 ml  Output 300 ml  Net -300 ml    LBM: Last BM Date: 10/01/18 Baseline Weight: Weight: 53.5 kg Most recent weight: Weight: 53.5 kg     Palliative Assessment/Data:   PPS 40%  Time In:  1415 Time Out:  1515 Time Total:  60 Greater than 50%  of this time was spent counseling and coordinating care related to the above assessment and plan.  Signed by: Loistine Chance, MD  [phone] Please contact Palliative Medicine Team phone at [phone] for questions and concerns.  For individual provider: See Shea Evans

## 2018-10-05 NOTE — Evaluation (Signed)
Physical Therapy Evaluation Patient Details Name: [name] MRN: [id number] DOB: 1928/12/21 Today's Date: 10/05/2018   History of Present Illness  84 yo female admitted for confusion, back pain, and bilateral pleural effusions on 11/9. CT of pelvis reveals multiple lytic lesions to pelvis and sacrum, possible metastatic disease/multiple myeloma. L thoracocentesis 11/11 with 1L fluid removal. PMH includes basal cell carcinoma, breast cancer, barrett esophagus, HTN, IBS, OA, OP, pre-DM.     Clinical Impression  Pt presents with widespread weakness/deconditioning, severe pain in hips and back especially with moving and sitting, difficulty performing bed mobility, and decreased activity tolerance. Pt to benefit from acute PT to address deficits. Pt required max assist for all bed mobility this session. On two occasions during PT session, pt stated she wished to die because she was in too much pain. PT recommending SNF after hospitalization given current assist level and severity of physical deficits. PT to  continue to follow acutely.      Follow Up Recommendations Supervision for mobility/OOB;SNF    Equipment Recommendations  None recommended by PT    Recommendations for Other Services       Precautions / Restrictions Precautions Precautions: Fall Restrictions Weight Bearing Restrictions: No      Mobility  Bed Mobility Overal bed mobility: Needs Assistance Bed Mobility: Sidelying to Sit;Rolling;Sit to Sidelying Rolling: Max assist Sidelying to sit: Max assist;HOB elevated     Sit to sidelying: Max assist;HOB elevated General bed mobility comments: Max assist for rolling, decreased initiation of LE flexion and UE reaching to assist in rolling. Pt with shoulder cavitation mid-roll, painless. Pt rolled bilaterally for removal of pillows with max assist. sidelying<>sit required max assist for trunk elevation, LE management, and scooting to EOB. When PT asked pt to assist in  scooting to EOB, pt stated it hurt too badly. Pt sat EOB ~5 minutes.    Transfers Overall transfer level: (NT - pt in too much pain and too weak)                  Ambulation/Gait                Stairs            Wheelchair Mobility    Modified Rankin (Stroke Patients Only)       Balance Overall balance assessment: Needs assistance Sitting-balance support: Feet supported;Bilateral upper extremity supported Sitting balance-Leahy Scale: Poor Sitting balance - Comments: Pt encouraged UE propping, otherwise mod-max assist to maintain pt in sitting.                                      Pertinent Vitals/Pain Pain Assessment: Faces Faces Pain Scale: Hurts little more Pain Location: back, hips  Pain Descriptors / Indicators: Aching;Sore Pain Intervention(s): Limited activity within patient's tolerance;Repositioned;Monitored during session    [place] expects to be discharged to:: Private residence Living Arrangements: Spouse/significant other Available Help at Discharge: Family;Available PRN/intermittently(husband, daughter comes over intermittently ) Type of Home: House Home Access: Stairs to enter Entrance Stairs-Rails: None(R handrail ) Entrance Stairs-Number of Steps: 2 Home Layout: One level Home Equipment: Walker - 2 wheels;Wheelchair - Sport and exercise psychologist Comments: wheelchair on loan from church.     Prior Function Level of Independence: Needs assistance      ADL's / Homemaking Assistance Needed: Pt's husband dressing, bathing, cooking, and cleaning over the past 4 weeks when pt's pain  in back/pelvis started.   Comments: used wheelchair some in home and community, and used wheelchair as RW intermittently at home.      Hand Dominance   Dominant Hand: Right    Extremity/Trunk Assessment        Lower Extremity Assessment Lower Extremity Assessment: RLE deficits/detail;LLE deficits/detail RLE  Deficits / Details: bilateral LEs <3/5 bilaterally, unable to fully assess due to pain but required max assist for all mobility  RLE: Unable to fully assess due to pain LLE Deficits / Details: bilateral LEs <3/5 bilaterally, unable to fully assess due to pain but required max assist for all mobility  LLE: Unable to fully assess due to pain    Cervical / Trunk Assessment Cervical / Trunk Assessment: Kyphotic  Communication   Communication: Receptive difficulties  Cognition Arousal/Alertness: Lethargic Behavior During Therapy: WFL for tasks assessed/performed Overall Cognitive Status: Impaired/Different from baseline Area of Impairment: Orientation;Following commands;Problem solving                 Orientation Level: Person;Disoriented to     Following Commands: Follows one step commands inconsistently     Problem Solving: Decreased initiation;Slow processing;Difficulty sequencing;Requires verbal cues;Requires tactile cues General Comments: It took pt multiple attempts to state birthday correctly, with help from husband. Required VC throughout session to perform mobility tasks.       General Comments      Exercises     Assessment/Plan    PT Assessment Patient needs continued PT services  PT Problem List Decreased strength;Pain;Decreased range of motion;Decreased activity tolerance;Decreased balance;Decreased safety awareness;Decreased mobility       PT Treatment Interventions DME instruction;Therapeutic activities;Therapeutic exercise;Patient/family education;Gait training;Balance training;Functional mobility training    PT Goals (Current goals can be found in the Care Plan section)  Acute Rehab PT Goals Patient Stated Goal: none stated  PT Goal Formulation: With patient Time For Goal Achievement: 10/19/18 Potential to Achieve Goals: Good    Frequency Min 2X/week   Barriers to discharge        Co-evaluation               AM-PAC PT "6 Clicks" Daily  Activity  Outcome Measure Difficulty turning over in bed (including adjusting bedclothes, sheets and blankets)?: Unable Difficulty moving from lying on back to sitting on the side of the bed? : Unable Difficulty sitting down on and standing up from a chair with arms (e.g., wheelchair, bedside commode, etc,.)?: Unable Help needed moving to and from a bed to chair (including a wheelchair)?: Total Help needed walking in hospital room?: Total Help needed climbing 3-5 steps with a railing? : Total 6 Click Score: 6    End of Session Equipment Utilized During Treatment: Gait belt Activity Tolerance: Patient limited by pain;Patient limited by fatigue Patient left: in bed;with call bell/phone within reach;with family/visitor present Nurse Communication: Mobility status PT Visit Diagnosis: Muscle weakness (generalized) (M62.81);Pain Pain - Right/Left: Left(both) Pain - part of body: Hip    Time: 2979-8921 PT Time Calculation (min) (ACUTE ONLY): 41 min   Charges:   PT Evaluation $PT Eval Low Complexity: 1 Low PT Treatments $Therapeutic Activity: 8-22 mins       Julien Girt, PT Acute Rehabilitation Services Pager [phone]  Office [phone]  Pj Zehner D Elonda Husky 10/05/2018, 4:12 PM

## 2018-10-05 NOTE — Progress Notes (Addendum)
IP PROGRESS NOTE  Subjective:   [name] reports hurting "all over ".  Her daughter is at the bedside.  She reports the pain is relieved with hydrocodone. [name] underwent a left thoracentesis yesterday.  The cytology is pending.  Objective: Vital signs in last 24 hours: Blood pressure (!) 140/53, pulse 99, temperature 98.5 F (36.9 C), temperature source Oral, resp. rate 20, height 5\' 3"  (1.6 m), weight 117 lb 15.1 oz (53.5 kg), SpO2 94 %.  Intake/Output from previous day: 11/11 0701 - 11/12 0700 In: 0  Out: 950 [Urine:950]  Physical Exam:  HEENT: Fullness at the left supraclavicular fossa Lungs: Clear anteriorly, no respiratory distress  Cardiac: Regular rate and rhythm Extremities: No leg edema Abdomen: No hepatosplenomegaly, soft and nontender Neurologic: Alert, oriented to place and year     Lab Results: Recent Labs    10/04/18 0549 10/05/18 0624  WBC 14.0* 13.0*  HGB 9.5* 9.8*  HCT 31.5* 32.4*  PLT 300 320    BMET Recent Labs    10/04/18 0549 10/05/18 0624  NA 132* 132*  K 3.8 3.6  CL 101 100  CO2 22 22  GLUCOSE 85 68*  BUN 26* 30*  CREATININE 1.13* 1.12*  CALCIUM 11.5* 10.6*    No results found for: CEA1  Studies/Results: Dg Chest 1 View  Result Date: 10/04/2018 CLINICAL DATA:  Status post left thoracentesis. EXAM: CHEST  1 VIEW COMPARISON:  Radiograph of October 04, 2018. FINDINGS: Stable cardiomediastinal silhouette. No pneumothorax is noted. Left pleural effusion noted on prior exam is significantly decreased. Mild right basilar atelectasis and effusion is noted. Residual left basilar subsegmental atelectasis is noted. Bony thorax is unremarkable. IMPRESSION: Significantly decreased left pleural effusion status post thoracentesis. No pneumothorax is noted. Electronically Signed   By: Marijo Conception, M.D.   On: 10/04/2018 12:40   Ct Soft Tissue Neck Wo Contrast  Result Date: 10/03/2018 CLINICAL DATA:  Metastatic cancer. EXAM: CT NECK  WITHOUT CONTRAST TECHNIQUE: Multidetector CT imaging of the neck was performed following the standard protocol without intravenous contrast. COMPARISON:  None. FINDINGS: Pharynx and larynx: No mucosal or submucosal lesion. Salivary glands: Parotid and submandibular glands are normal. Thyroid: Multiple thyroid nodules. This could be benign goiter. Consider thyroid ultrasound for more accurate evaluation. Lymph nodes: No enlarged or low-density nodes in the neck region. There slightly prominent nodes in the supraclavicular fat on both sides. These are nonspecific. They could represent reactive nodes or malignancy. Vascular: Ordinary atherosclerotic calcification of the carotid bifurcation regions. Limited intracranial: Negative Visualized orbits: Normal Mastoids and visualized paranasal sinuses: Clear Skeleton: Lytic foci scattered throughout the cervical and thoracic spine consistent with osseous metastatic disease. No evidence of pathologic fracture in the cervical region. Possible pathologic fracture at T4. No discernible tumor involvement of the canal, but CT is not sensitive. Upper chest: See results of chest CT Other: None IMPRESSION: Lytic metastatic lesions affecting the cervical and upper thoracic spine. No evidence of pathologic fracture in the cervical region. Probable pathologic fracture of T4. See results of chest CT. Slightly prominent supraclavicular lymph nodes on each side. No abnormal nodes higher in the neck. Multiple thyroid nodules. Most likely diagnosis is benign goiter, but thyroid ultrasound could be performed for more accurate evaluation if primary carcinoma is not identified elsewhere. In the absence of regional lymph nodes, it would be unusual for this to be thyroid carcinoma. Electronically Signed   By: Nelson Chimes M.D.   On: 10/03/2018 10:55   Ct Chest  Wo Contrast  Result Date: 10/03/2018 CLINICAL DATA:  Patient with altered mental status. Lytic lesions within the pelvis. Effusion.  EXAM: CT CHEST WITHOUT CONTRAST TECHNIQUE: Multidetector CT imaging of the chest was performed following the standard protocol without IV contrast. COMPARISON:  CT pelvis 10/02/2018; chest radiograph 10/02/2018 FINDINGS: Cardiovascular: Heart is enlarged. Trace pericardial effusion. Thoracic aortic vascular calcification. Mediastinum/Nodes: Prominent 9 mm left axillary lymph node (image 46; series 2). 2.6 x 3.5 cm left subpectoral lymph node. 1.2 cm right axillary lymph node (image 41; series 2). 1.0 cm node superior mediastinum (image 40; series 2). Normal appearance of the esophagus. Heterogeneous thyroid. Lungs/Pleura: Central airways are patent. Moderate left and small right pleural effusions. Subpleural consolidation within the left and right lower lobes. No pneumothorax. Upper Abdomen: Small volume perihepatic ascites. Musculoskeletal: Posterior left ninth, tenth and eleventh healing rib fractures. Healing posterior right sixth rib fracture. Multiple healing lateral right rib fractures involving the third and seventh ribs. Mixed lucent and sclerotic lesions involving the T1, T2, T4 and T12 vertebral bodies. There is a mild pathologic fracture of the T4 vertebral body with associated height loss. Prior left mastectomy. Small lucent lesions involving the ribs bilaterally. IMPRESSION: 1. Large left subpectoral lymph node. Additionally there is prominent left axillary and enlarged right axillary adenopathy. Findings are concerning for metastatic disease. 2. Lytic lesions involving the thoracic spine and ribs concerning for osseous metastatic disease. There is mild height loss of the T4 vertebral body most compatible with pathologic fracture. 3. Moderate left and small right layering pleural effusions. There is underlying consolidation within the adjacent pulmonary parenchyma which may represent atelectasis or infection. 4. Aortic Atherosclerosis (ICD10-I70.0). Electronically Signed   By: Lovey Newcomer M.D.   On:  10/03/2018 12:54   US Renal  Result Date: 10/03/2018 CLINICAL DATA:  Decreased urine output. History of RIGHT nephrectomy. EXAM: RENAL / URINARY TRACT ULTRASOUND COMPLETE COMPARISON:  CT pelvis dated 10/02/2018. FINDINGS: Right Kidney: Surgically removed. Left Kidney: Renal measurements: 11.9 x 6.2 x 6.2 cm = volume: 240 mL. Mild to moderate hydronephrosis. LEFT renal cyst measures 2.4 cm. Bladder: Appears normal for degree of bladder distention. LEFT ureteral jet is visualized. IMPRESSION: 1. LEFT renal hydronephrosis, mild to moderate in degree. Suspect some degree of associated obstruction at the level of the bladder wall thickening as described on recent pelvis CT of 10/02/2018. However, a LEFT ureteral jet is visualized at the level of the bladder indicating patency of the LEFT UVJ. 2. Status post RIGHT nephrectomy. Electronically Signed   By: Franki Cabot M.D.   On: 10/03/2018 18:54   Dg Chest Port 1 View  Result Date: 10/04/2018 CLINICAL DATA:  84 year old female. Pleural effusion. Subsequent encounter. EXAM: PORTABLE CHEST 1 VIEW COMPARISON:  10/03/2018 chest CT and 10/02/2018 chest x-ray. FINDINGS: Rotation to the right. When compared to prior chest x-ray, progressive pulmonary vascular congestion/pulmonary edema. Increase in size of left-sided pleural effusion. Left base consolidation may represent atelectasis or infiltrate. Cardiomegaly. IMPRESSION: 1. Progressive pulmonary vascular congestion/pulmonary edema. 2. Increase in size of left-sided pleural effusion. Left base consolidation may represent atelectasis or infiltrate. 3. Cardiomegaly. Electronically Signed   By: Genia Del M.D.   On: 10/04/2018 07:03   US Thoracentesis Asp Pleural Space W/img Guide  Result Date: 10/04/2018 INDICATION: Patient with remote history of breast cancer, lytic bony lesions, adenopathy, bilateral pleural effusions; request received for diagnostic and therapeutic left thoracentesis. EXAM: ULTRASOUND GUIDED  DIAGNOSTIC AND THERAPEUTIC LEFT THORACENTESIS MEDICATIONS: None COMPLICATIONS: None immediate. PROCEDURE: An  ultrasound guided thoracentesis was thoroughly discussed with the patient/spouse/daughter and questions answered. The benefits, risks, alternatives and complications were also discussed. The patient understands and wishes to proceed with the procedure. Written consent was obtained. Ultrasound was performed to localize and mark an adequate pocket of fluid in the left chest. The area was then prepped and draped in the normal sterile fashion. 1% Lidocaine was used for local anesthesia. Under ultrasound guidance a 6 Fr Safe-T-Centesis catheter was introduced. Thoracentesis was performed. The catheter was removed and a dressing applied. FINDINGS: A total of approximately 930 cc of slightly hazy, yellow fluid was removed. Samples were sent to the laboratory as requested by the clinical team. IMPRESSION: Successful ultrasound guided diagnostic and therapeutic left thoracentesis yielding 930 cc of pleural fluid. Read by: Rowe Robert, PA-C Electronically Signed   By: Markus Daft M.D.   On: 10/04/2018 13:08    Medications: I have reviewed the patient's current medications.  Assessment/Plan:  1.  Hypercalcemia-status post Zometa on 10/02/2018, improved 2.  Lytic bone lesions on CT pelvis 10/02/2018 3.  Rectal and bladder wall thickening on CT pelvis 10/02/2018 4.  History of left breast DCIS, status post a left mastectomy in 2001 5.  Altered mental status secondary to #1 6.  History of Barrett's esophagus 7.  Osteoporosis 8.  Nephrectomy in 1955 9.  Pain secondary to lytic bone lesions 10. Left neck mass 11. Anemia 12.  Bilateral pleural effusions-status post a left thoracentesis 10/04/2018  [name] appears unchanged.  She is alert and the confusion appears partially improved.  The hypercalcemia is correcting following Zometa and hydration.  She has diffuse bone pain, partially relieved with  hydrocodone.  I will add prednisone.  We are avoiding nonsteroidals secondary to the nephrectomy.  I discussed disposition plans with her daughter.  We discussed home care, home hospice care, skilled nursing facility placement, and residential hospice.  She does not appear to be a candidate for residential hospice at present. We will make a decision on a trial of systemic therapy versus comfort care based on the biopsy result.  I also discussed palliative radiation if there is one site where the pain is most severe.  Recommendations: 1.  Biopsy of left subpectoral lymph node today 2.  Add prednisone for pain and anorexia 3.  Physical therapy evaluation, ambulate as tolerated 4.  Begin discussions of disposition plan with her family 5.  Change to regular diet and discontinue telemetry    LOS: 3 days   Betsy Coder, MD   10/05/2018, 7:25 AM

## 2018-10-05 NOTE — Consult Note (Signed)
[facility] Nurse wound consult note Reason for Consult: Patient with small degree of moisture associated skin damage in the perineal area and a linear skin tear on the left medial buttock Wound type: moisture, trauma Pressure Injury POA: NA Measurement: 3cm x 0.5cm x 0.1cm denuded area on the left medial aspect of the lower buttock, partial thickness.  MASD to perineum without evidence of fungal overgrowth. Wound XYB:FXOV, moist Drainage (amount, consistency, odor)scant serous  Periwound: itact Dressing procedure/placement/frequency: I will provide a mattress replacement, bilateral Prevalon pressure redistribution heel boots and ask nursing to continue their POC for side to side repositioning while in bed. Topical care will be with Gerhart's Butt cream applied three times daily (zinc oxide/hydrocortisone cream/lotrimin cream).  [facility] nursing team will not follow, but will remain available to this patient, the nursing and medical teams.  Please re-consult if needed. Thanks, Maudie Flakes, MSN, RN, [place], Arther Abbott  Pager# [phone]

## 2018-10-06 ENCOUNTER — Inpatient Hospital Stay (HOSPITAL_COMMUNITY): Payer: Medicare Other

## 2018-10-06 DIAGNOSIS — N1330 Unspecified hydronephrosis: Secondary | ICD-10-CM

## 2018-10-06 LAB — IMMUNOFIXATION ELECTROPHORESIS
IGA: 356 mg/dL (ref 64–422)
IGG (IMMUNOGLOBIN G), SERUM: 1027 mg/dL (ref 700–1600)
IGM (IMMUNOGLOBULIN M), SRM: 129 mg/dL (ref 26–217)
Total Protein ELP: 6.1 g/dL (ref 6.0–8.5)

## 2018-10-06 LAB — COMPREHENSIVE METABOLIC PANEL
ALBUMIN: 2.5 g/dL — AB (ref 3.5–5.0)
ALK PHOS: 109 U/L (ref 38–126)
ALT: 33 U/L (ref 0–44)
ANION GAP: 14 (ref 5–15)
AST: 69 U/L — ABNORMAL HIGH (ref 15–41)
BILIRUBIN TOTAL: 0.9 mg/dL (ref 0.3–1.2)
BUN: 34 mg/dL — ABNORMAL HIGH (ref 8–23)
CO2: 20 mmol/L — ABNORMAL LOW (ref 22–32)
Calcium: 10.1 mg/dL (ref 8.9–10.3)
Chloride: 100 mmol/L (ref 98–111)
Creatinine, Ser: 1.05 mg/dL — ABNORMAL HIGH (ref 0.44–1.00)
GFR calc Af Amer: 54 mL/min — ABNORMAL LOW (ref >60)
GFR calc non Af Amer: 46 mL/min — ABNORMAL LOW (ref >60)
GLUCOSE: 117 mg/dL — AB (ref 70–99)
Potassium: 4.2 mmol/L (ref 3.5–5.1)
Sodium: 134 mmol/L — ABNORMAL LOW (ref 135–145)
TOTAL PROTEIN: 6.1 g/dL — AB (ref 6.5–8.1)

## 2018-10-06 LAB — CBC WITH DIFFERENTIAL/PLATELET
Abs Immature Granulocytes: 0.73 10*3/uL — ABNORMAL HIGH (ref 0.00–0.07)
BASOS ABS: 0.1 10*3/uL (ref 0.0–0.1)
Basophils Relative: 0 %
EOS PCT: 0 %
Eosinophils Absolute: 0 10*3/uL (ref 0.0–0.5)
HCT: 33.6 % — ABNORMAL LOW (ref 36.0–46.0)
HEMOGLOBIN: 10.4 g/dL — AB (ref 12.0–15.0)
Immature Granulocytes: 5 %
LYMPHS PCT: 5 %
Lymphs Abs: 0.7 10*3/uL (ref 0.7–4.0)
MCH: 26.2 pg (ref 26.0–34.0)
MCHC: 31 g/dL (ref 30.0–36.0)
MCV: 84.6 fL (ref 80.0–100.0)
Monocytes Absolute: 0.4 10*3/uL (ref 0.1–1.0)
Monocytes Relative: 3 %
Neutro Abs: 12.2 10*3/uL — ABNORMAL HIGH (ref 1.7–7.7)
Neutrophils Relative %: 87 %
Platelets: 387 10*3/uL (ref 150–400)
RBC: 3.97 MIL/uL (ref 3.87–5.11)
RDW: 16.1 % — ABNORMAL HIGH (ref 11.5–15.5)
WBC: 14.1 10*3/uL — ABNORMAL HIGH (ref 4.0–10.5)
nRBC: 0 % (ref 0.0–0.2)

## 2018-10-06 LAB — UPEP/UIFE/LIGHT CHAINS/TP, 24-HR UR
% BETA, Urine: 0 %
ALBUMIN, U: 100 %
ALPHA 1 URINE: 0 %
ALPHA 2 UR: 0 %
Free Kappa Lt Chains,Ur: 17.3 mg/L (ref 1.35–24.19)
Free Kappa/Lambda Ratio: 21.36 — ABNORMAL HIGH (ref 2.04–10.37)
Free Lambda Lt Chains,Ur: 0.81 mg/L (ref 0.24–6.66)
GAMMA GLOBULIN URINE: 0 %
TOTAL PROTEIN, URINE-UPE24: 6.6 mg/dL
Total Protein, Urine-Ur/day: 63 mg/24 hr (ref 30–150)
Total Volume: 950

## 2018-10-06 LAB — TSH: TSH: 1.999 u[IU]/mL (ref 0.350–4.500)

## 2018-10-06 LAB — PH, BODY FLUID: PH, BODY FLUID: 7.3

## 2018-10-06 LAB — MAGNESIUM: MAGNESIUM: 2.2 mg/dL (ref 1.7–2.4)

## 2018-10-06 LAB — T4, FREE: Free T4: 1.24 ng/dL (ref 0.82–1.77)

## 2018-10-06 LAB — PHOSPHORUS: Phosphorus: 2.8 mg/dL (ref 2.5–4.6)

## 2018-10-06 MED ORDER — MORPHINE SULFATE ER 15 MG PO TBCR
15.0000 mg | EXTENDED_RELEASE_TABLET | Freq: Every morning | ORAL | Status: DC
  Administered 2018-10-06 – 2018-10-07 (×2): 15 mg via ORAL
  Filled 2018-10-06 (×2): qty 1

## 2018-10-06 MED ORDER — SENNA 8.6 MG PO TABS
1.0000 | ORAL_TABLET | Freq: Every day | ORAL | Status: DC
  Administered 2018-10-07: 8.6 mg via ORAL
  Filled 2018-10-06: qty 1

## 2018-10-06 MED ORDER — IPRATROPIUM-ALBUTEROL 0.5-2.5 (3) MG/3ML IN SOLN: 3.0000 mL | RESPIRATORY_TRACT | Status: DC | PRN

## 2018-10-06 MED ORDER — POLYETHYLENE GLYCOL 3350 17 G PO PACK
17.0000 g | PACK | Freq: Every day | ORAL | Status: DC
  Administered 2018-10-06 – 2018-10-07 (×2): 17 g via ORAL
  Filled 2018-10-06 (×2): qty 1

## 2018-10-06 MED ORDER — OXYCODONE HCL 5 MG PO TABS
5.0000 mg | ORAL_TABLET | ORAL | Status: DC | PRN
  Administered 2018-10-06 – 2018-10-07 (×3): 5 mg via ORAL
  Filled 2018-10-06 (×3): qty 1

## 2018-10-06 NOTE — Progress Notes (Signed)
IP PROGRESS NOTE  Subjective:   Ms. Keady is alert.  Her family is at the bedside.  She continues to have pain.  The pain is not relieved with hydrocodone or tramadol.  Her family reports she is less confused.  Objective: Vital signs in last 24 hours: Blood pressure (!) 150/65, pulse (!) 102, temperature 97.6 F (36.4 C), resp. rate 16, height 5\' 3"  (1.6 m), weight 130 lb 9.6 oz (59.2 kg), SpO2 95 %.  Intake/Output from previous day: 11/12 0701 - 11/13 0700 In: 50 [IV Piggyback:50] Out: 4742 [Urine:1375]  Physical Exam: Not performed today      Lab Results: Recent Labs    10/05/18 0624 10/06/18 0545  WBC 13.0* 14.1*  HGB 9.8* 10.4*  HCT 32.4* 33.6*  PLT 320 387    BMET Recent Labs    10/05/18 0624 10/06/18 0545  NA 132* 134*  K 3.6 4.2  CL 100 100  CO2 22 20*  GLUCOSE 68* 117*  BUN 30* 34*  CREATININE 1.12* 1.05*  CALCIUM 10.6* 10.1    No results found for: CEA1  Studies/Results: Dg Chest 1 View  Result Date: 10/04/2018 CLINICAL DATA:  Status post left thoracentesis. EXAM: CHEST  1 VIEW COMPARISON:  Radiograph of October 04, 2018. FINDINGS: Stable cardiomediastinal silhouette. No pneumothorax is noted. Left pleural effusion noted on prior exam is significantly decreased. Mild right basilar atelectasis and effusion is noted. Residual left basilar subsegmental atelectasis is noted. Bony thorax is unremarkable. IMPRESSION: Significantly decreased left pleural effusion status post thoracentesis. No pneumothorax is noted. Electronically Signed   By: Marijo Conception, M.D.   On: 10/04/2018 12:40   US Renal  Result Date: 10/05/2018 CLINICAL DATA:  Hydronephrosis. EXAM: RENAL / URINARY TRACT ULTRASOUND COMPLETE COMPARISON:  Ultrasound 10/04/2018. FINDINGS: Right Kidney: Right nephrectomy. Left Kidney: Renal measurements: 12.1 x 6.0 x 6.7 cm = volume: 254.2 cc mL. Echogenicity within normal limits. Mild hydronephrosis. 2.2 cm simple cyst. Bladder: Foley catheter  noted bladder.  Bladder is nondistended. Bilateral pleural effusions noted.  Ascites noted. IMPRESSION: 1. Mild to moderate left hydronephrosis. Similar findings noted on prior exam. 2.  Prior right nephrectomy. Electronically Signed   By: Marcello Moores  Register   On: 10/05/2018 13:40   Dg Chest Port 1 View  Result Date: 10/05/2018 CLINICAL DATA:  Shortness of breath EXAM: PORTABLE CHEST 1 VIEW COMPARISON:  10/04/2018 FINDINGS: Cardiac shadow is stable. The lungs are well aerated bilaterally. Small bilateral pleural effusions are again seen and stable. Left basilar atelectatic changes are noted slightly greater than that seen on the prior exam. No pneumothorax is noted. IMPRESSION: Slight increase in left basilar atelectasis. Persistent small pleural effusions. Electronically Signed   By: Inez Catalina M.D.   On: 10/05/2018 08:26   [place] Core Biopsy (lymph Nodes)  Result Date: 10/05/2018 INDICATION: 84 year old female with a history of left breast carcinoma. Likely metastatic/recurrent disease to the lymph nodes. She presents for biopsy EXAM: ULTRASOUND GUIDED BIOPSY SUBPECTORAL LYMPH NODE MEDICATIONS: None. ANESTHESIA/SEDATION: Moderate (conscious) sedation was employed during this procedure. A total of Versed 0.5 mg and Fentanyl 25 mcg was administered intravenously. Moderate Sedation Time: 10 minutes. The patient's level of consciousness and vital signs were monitored continuously by radiology nursing throughout the procedure under my direct supervision. FLUOROSCOPY TIME:  None COMPLICATIONS: None PROCEDURE: Informed written consent was obtained from the patient after a thorough discussion of the procedural risks, benefits and alternatives. All questions were addressed. Maximal Sterile Barrier Technique was utilized including caps, mask,  sterile gowns, sterile gloves, sterile drape, hand hygiene and skin antiseptic. A timeout was performed prior to the initiation of the procedure. Patient positioned supine  position on the ultrasound table. Images of the left chest were performed with images stored sent to PACs. The patient is prepped and draped in the usual sterile fashion. 1% lidocaine was used for local anesthesia. Using ultrasound guidance, guide needle was advanced into the subpectoral pathologic lymph node. Multiple 18 gauge core biopsy were performed. Needles removed. Final image was stored. Patient tolerated the procedure well and remained hemodynamically stable throughout. No complications were encountered and no significant blood loss. IMPRESSION: Status post ultrasound-guided biopsy of left subpectoral lymph node. Tissue specimen sent to pathology for complete histopathologic analysis. Signed, Dulcy Fanny. Dellia Nims, RPVI [facility] Electronically Signed   By: Corrie Mckusick D.O.   On: 10/05/2018 12:59   US Thoracentesis Asp Pleural Space W/img Guide  Result Date: 10/04/2018 INDICATION: Patient with remote history of breast cancer, lytic bony lesions, adenopathy, bilateral pleural effusions; request received for diagnostic and therapeutic left thoracentesis. EXAM: ULTRASOUND GUIDED DIAGNOSTIC AND THERAPEUTIC LEFT THORACENTESIS MEDICATIONS: None COMPLICATIONS: None immediate. PROCEDURE: An ultrasound guided thoracentesis was thoroughly discussed with the patient/spouse/daughter and questions answered. The benefits, risks, alternatives and complications were also discussed. The patient understands and wishes to proceed with the procedure. Written consent was obtained. Ultrasound was performed to localize and mark an adequate pocket of fluid in the left chest. The area was then prepped and draped in the normal sterile fashion. 1% Lidocaine was used for local anesthesia. Under ultrasound guidance a 6 Fr Safe-T-Centesis catheter was introduced. Thoracentesis was performed. The catheter was removed and a dressing applied. FINDINGS: A total of  approximately 930 cc of slightly hazy, yellow fluid was removed. Samples were sent to the laboratory as requested by the clinical team. IMPRESSION: Successful ultrasound guided diagnostic and therapeutic left thoracentesis yielding 930 cc of pleural fluid. Read by: Rowe Robert, PA-C Electronically Signed   By: Markus Daft M.D.   On: 10/04/2018 13:08    Medications: I have reviewed the patient's current medications.  Assessment/Plan:  1.  Hypercalcemia-status post Zometa on 10/02/2018, improved 2.  Lytic bone lesions on CT pelvis 10/02/2018 3.  Rectal and bladder wall thickening on CT pelvis 10/02/2018 4.  History of left breast DCIS, status post a left mastectomy in 2001 5.  Altered mental status secondary to #1 6.  History of Barrett's esophagus 7.  Osteoporosis 8.  Nephrectomy in 1955 9.  Pain secondary to lytic bone lesions 10. Left neck mass 11. Anemia 12.  Bilateral pleural effusions-status post a left thoracentesis 10/04/2018  Ms.Fulbright is more alert and appears less confused.  She continues to have pain involving the bones.  I discussed pain control with her family.  She will continue prednisone.  I added low-dose MS Contin.  She will try oxycodone for breakthrough pain.  Ms. Felling and her family indicate they wish to pursue a comfort/palliative care approach.  We will make a referral to [facility] and palliative care.  The family does not feel she can be cared for in the home.  We will also make a referral to care management.  The pleural fluid cytology and lymph node biopsy pathology has not been finalized.  I discussed the pathology with Dr. Melina Copa.  She indicates the histology is concerning for a lymphoma.  Immunohistochemical stains are pending and should be reported on 10/07/2018.  I will  discuss treatment options with Ms. Pfluger and her family.  We will respect her decision to pursue comfort care if she decides against treatment.  Recommendations: 1.  Continue prednisone  and MS Contin for pain 2.  Care management consult for placement 3.  Referral to [facility] for outpatient palliative care 4.  Discontinue telemetry    LOS: 4 days   Betsy Coder, MD   10/06/2018, 8:25 AM

## 2018-10-06 NOTE — Care Management Important Message (Signed)
Important Message  Patient Details  Name: ZOLLIE CLEMENCE MRN: [id number] Date of Birth: [birth year]   Medicare Important Message Given:  Yes    Kerin Salen 10/06/2018, 10:50 AMImportant Message  Patient Details  Name: NYKERRIA MACCONNELL MRN: [id number] Date of Birth: 1929/05/30   Medicare Important Message Given:  Yes    Kerin Salen 10/06/2018, 10:49 AM

## 2018-10-06 NOTE — Progress Notes (Signed)
PROGRESS NOTE                                                                                                                                                                                                             Patient Demographics:    [name], is a 84 y.o. female, DOB - 06-Nov-1929, SWH:[id number]  Admit date - 10/02/2018   Admitting Physician Shela Leff, MD  Outpatient Primary MD for the patient is Janie Morning, DO  LOS - 4  Outpatient Specialists: Dr. Learta Codding  Chief Complaint  Patient presents with  . Altered Mental Status       Brief Narrative   84 year old female with history of breast cancer, skin cancer, hypertension, hyperlipidemia presented with altered mental status as per her family.  Also complained of nausea but no vomiting. In the ED vitals were stable with mildly elevated WBC of 13 K, corrected calcium of 14.9 and chest x-ray showing mild left pleural effusion.  Head CT unremarkable while CT of the pelvis showed multiple lytic lesion in the sacrum and the pelvis (largest seen posteriorly in the right sacrum).  This was suspicious of metastatic disease versus possible multiple myeloma. Admitted for IV hydration and further work-up and biopsy of the left subpectoral lymph node..    Subjective:   Reports feeling tired and low back pain.   Assessment  & Plan :    Principal Problem:   Hypercalcemia of malignancy Pleural fluid cytology and lymph node biopsy result pending.  Oncology discussed with the pathologist who informed that histology was concerning for lymphoma.  Final report should be available tomorrow. Dr. Malachy Mood discussed treatment options with patient and her family who wish to pursue comfort care and refused against aggressive treatment. Calcium level has normalized in a.m. lab. Currently on prednisone and MS Contin for pain.  Outpatient referral to palliative care and  hospice.  Will benefit from SNF as her husband is elderly and unable to take care of her at home.  PT recommends SNF.  Social work consulted.   Active Problems:   Acute metabolic encephalopathy Secondary to severe hypercalcemia.  No signs of infection.  Head CT unremarkable.  Now appears to have improved to baseline.  Left pleural effusion Underwent thoracentesis.  Cytology from the pleural fluid showing lymphoid cells.  Normocytic anemia Stable.  No need for  transfusion.  Leukocytosis?  Mild proctocolitis on CT. No clinical signs of infection.  No antibiotics given.  Hyponatremia Mild.  Resolved with fluids.  Multiple thyroid nodules Check TSH/free T4.  Perineal area and buttocks skin tear Wound care consult appreciated.Gerhart's Butt cream applied three times daily (zinc oxide/hydrocortisone cream/lotrimin cream).  Left hydronephrosis Seen in CT.  Had poor urine output during hospital stay.  Foley placed and discussed with urology who recommend to continue Foley placement.  Has had good urine output in the past 24 hours.  Urology evaluation pending.  Code Status : DNR  Family Communication  : Husband and daughter at bedside  Disposition Plan  : Possibly SNF with palliative care follow-up as outpatient  Barriers For Discharge : Improving symptoms  Consults  : Oncology, palliative care  Procedures  : CT abdomen, ultrasound-guided biopsy of left subpectoral lymph node and left pleural fluid  DVT Prophylaxis  :  Lovenox -  Lab Results  Component Value Date   PLT 387 10/06/2018    Antibiotics    Anti-infectives (From admission, onward)   None        Objective:   Vitals:   10/05/18 2056 10/06/18 0419 10/06/18 0843 10/06/18 1326  BP: (!) 139/55 (!) 150/65  (!) 134/53  Pulse: (!) 101 (!) 102  100  Resp: '16 16 16 18  '$ Temp: 97.6 F (36.4 C) 97.6 F (36.4 C)  98.2 F (36.8 C)  TempSrc:      SpO2: 95% 95% 93% 97%  Weight:  59.2 kg    Height:        Wt  Readings from Last 3 Encounters:  10/06/18 59.2 kg  12/05/15 61.2 kg  10/01/15 61.7 kg     Intake/Output Summary (Last 24 hours) at 10/06/2018 1551 Last data filed at 10/06/2018 1234 Gross per 24 hour  Intake 110 ml  Output 1625 ml  Net -1515 ml     Physical Exam  Gen: not in distress, fatigued HEENT: Pallor present, moist mucosa, supple neck Chest: clear b/l, no added sounds CVS: N S1&S2, no murmurs GI: soft, NT, ND, BS+, Foley in place draining clear urine Musculoskeletal: warm, no edema     Data Review:    CBC Recent Labs  Lab 10/02/18 1529 10/03/18 0542 10/04/18 0549 10/05/18 0624 10/06/18 0545  WBC 12.9* 11.4* 14.0* 13.0* 14.1*  HGB 10.5* 10.1* 9.5* 9.8* 10.4*  HCT 34.0* 32.0* 31.5* 32.4* 33.6*  PLT 323 320 300 320 387  MCV 84.8 84.4 85.1 85.3 84.6  MCH 26.2 26.6 25.7* 25.8* 26.2  MCHC 30.9 31.6 30.2 30.2 31.0  RDW 16.2* 16.4* 16.0* 16.2* 16.1*  LYMPHSABS 1.3  --  0.8 0.8 0.7  MONOABS 1.0  --  0.9 0.8 0.4  EOSABS 0.2  --  0.1 0.2 0.0  BASOSABS 0.1  --  0.1 0.1 0.1    Chemistries  Recent Labs  Lab 10/02/18 1529 10/03/18 0542 10/03/18 1606 10/04/18 0549 10/05/18 0624 10/06/18 0545  NA 132* 132* 132* 132* 132* 134*  K 3.6 3.5 3.5 3.8 3.6 4.2  CL 91* 96* 98 101 100 100  CO2 '31 27 26 22 22 '$ 20*  GLUCOSE 115* 108* 96 85 68* 117*  BUN 27* 25* 27* 26* 30* 34*  CREATININE 0.97 0.97 1.04* 1.13* 1.12* 1.05*  CALCIUM 14.3* 13.3* 12.6* 11.5* 10.6* 10.1  MG  --   --   --  1.7 1.7 2.2  AST 69* 64*  --  62* 63* 69*  ALT 24  24  --  28 26 33  ALKPHOS 124 104  --  93 93 109  BILITOT 0.8 0.6  --  0.5 0.5 0.9   ------------------------------------------------------------------------------------------------------------------ No results for input(s): CHOL, HDL, LDLCALC, TRIG, CHOLHDL, LDLDIRECT in the last 72 hours.  No results found for:  HGBA1C ------------------------------------------------------------------------------------------------------------------ No results for input(s): TSH, T4TOTAL, T3FREE, THYROIDAB in the last 72 hours.  Invalid input(s): FREET3 ------------------------------------------------------------------------------------------------------------------ No results for input(s): VITAMINB12, FOLATE, FERRITIN, TIBC, IRON, RETICCTPCT in the last 72 hours.  Coagulation profile Recent Labs  Lab 10/05/18 0624  INR 1.10    No results for input(s): DDIMER in the last 72 hours.  Cardiac Enzymes No results for input(s): CKMB, TROPONINI, MYOGLOBIN in the last 168 hours.  Invalid input(s): CK ------------------------------------------------------------------------------------------------------------------    Component Value Date/Time   BNP 243.6 (H) 10/04/2018 0549    Inpatient Medications  Scheduled Meds: . aspirin EC  81 mg Oral Daily  . enoxaparin (LOVENOX) injection  30 mg Subcutaneous QHS  . Gerhardt's butt cream   Topical TID  . morphine  15 mg Oral q morning - 10a  . polyethylene glycol  17 g Oral Daily  . predniSONE  10 mg Oral BID  . [START ON 10/07/2018] senna  1 tablet Oral Daily   Continuous Infusions: PRN Meds:.acetaminophen **OR** acetaminophen, hydrALAZINE, ipratropium-albuterol, ondansetron (ZOFRAN) IV, oxyCODONE, polyvinyl alcohol  Micro Results Recent Results (from the past 240 hour(s))  Urine Culture     Status: None   Collection Time: 10/02/18  7:20 PM  Result Value Ref Range Status   Specimen Description   Final    URINE, CLEAN CATCH Performed at [facility], [place] [address], [place], [place] [address]    Special Requests   Final    NONE Performed at [facility], [place] [address] [place], [place] [address]    Culture   Final    NO GROWTH Performed at [facility], [place] [address] [place], [place] [address]    Report  Status 10/04/2018 FINAL  Final  Acid Fast Smear (AFB)     Status: None   Collection Time: 10/04/18  1:01 PM  Result Value Ref Range Status   AFB Specimen Processing Concentration  Final   Acid Fast Smear Negative  Final    Comment: (NOTE) Performed At: [facility] [place], [place] [id number] Rush Farmer MD [id number]    Source (AFB) PLEURAL  Final    Comment: LEFT Performed at [facility], [place] [address], [place], [place] [address]   Culture, body fluid-bottle     Status: None (Preliminary result)   Collection Time: 10/04/18  1:01 PM  Result Value Ref Range Status   Specimen Description PLEURAL  Final   Special Requests NONE  Final   Culture   Final    NO GROWTH 2 DAYS Performed at [facility], [place] [address] [place], [place] [address]    Report Status PENDING  Incomplete  Gram stain     Status: None   Collection Time: 10/04/18  1:01 PM  Result Value Ref Range Status   Specimen Description PLEURAL  Final   Special Requests NONE  Final   Gram Stain   Final    WBC PRESENT,BOTH PMN AND MONONUCLEAR NO ORGANISMS SEEN CYTOSPIN SMEAR Performed at [facility], [address], [place], [place] [address]    Report Status 10/04/2018 FINAL  Final    Radiology Reports Dg  Chest 1 View  Result Date: 10/04/2018 CLINICAL DATA:  Status post left thoracentesis. EXAM: CHEST  1 VIEW COMPARISON:  Radiograph of October 04, 2018. FINDINGS: Stable cardiomediastinal silhouette. No pneumothorax is noted. Left pleural effusion noted on prior exam is significantly decreased. Mild right basilar atelectasis and effusion is noted. Residual left basilar subsegmental atelectasis is noted. Bony thorax is unremarkable. IMPRESSION: Significantly decreased left pleural effusion status post thoracentesis. No pneumothorax is noted. Electronically Signed   By: Marijo Conception, M.D.   On: 10/04/2018 12:40   Dg Chest 2 View  Result Date:  10/02/2018 CLINICAL DATA:  Chest pain. EXAM: CHEST - 2 VIEW COMPARISON:  Radiographs of September 07, 2018. FINDINGS: Stable cardiomediastinal silhouette. No pneumothorax is noted. Mild left pleural effusion is noted with probable underlying atelectasis or infiltrate. Minimal right pleural effusion is noted. Bony thorax is unremarkable. IMPRESSION: Mild left pleural effusion is noted with probable underlying atelectasis or infiltrate. Minimal right pleural effusion. Electronically Signed   By: Marijo Conception, M.D.   On: 10/02/2018 16:22   Ct Head Wo Contrast  Result Date: 10/02/2018 CLINICAL DATA:  Patient with chest pain.  Nausea. EXAM: CT HEAD WITHOUT CONTRAST TECHNIQUE: Contiguous axial images were obtained from the base of the skull through the vertex without intravenous contrast. COMPARISON:  None. FINDINGS: Brain: Ventricles and sulci are appropriate for patient's age. No evidence for acute cortically based infarct, intracranial hemorrhage, mass lesion or mass-effect. Vascular: Unremarkable Skull: Intact. Sinuses/Orbits: Paranasal sinuses well aerated. Mastoid air cells unremarkable. Orbits unremarkable. Other: None. IMPRESSION: No acute intracranial process. Electronically Signed   By: Lovey Newcomer M.D.   On: 10/02/2018 16:59   Ct Soft Tissue Neck Wo Contrast  Result Date: 10/03/2018 CLINICAL DATA:  Metastatic cancer. EXAM: CT NECK WITHOUT CONTRAST TECHNIQUE: Multidetector CT imaging of the neck was performed following the standard protocol without intravenous contrast. COMPARISON:  None. FINDINGS: Pharynx and larynx: No mucosal or submucosal lesion. Salivary glands: Parotid and submandibular glands are normal. Thyroid: Multiple thyroid nodules. This could be benign goiter. Consider thyroid ultrasound for more accurate evaluation. Lymph nodes: No enlarged or low-density nodes in the neck region. There slightly prominent nodes in the supraclavicular fat on both sides. These are nonspecific. They could  represent reactive nodes or malignancy. Vascular: Ordinary atherosclerotic calcification of the carotid bifurcation regions. Limited intracranial: Negative Visualized orbits: Normal Mastoids and visualized paranasal sinuses: Clear Skeleton: Lytic foci scattered throughout the cervical and thoracic spine consistent with osseous metastatic disease. No evidence of pathologic fracture in the cervical region. Possible pathologic fracture at T4. No discernible tumor involvement of the canal, but CT is not sensitive. Upper chest: See results of chest CT Other: None IMPRESSION: Lytic metastatic lesions affecting the cervical and upper thoracic spine. No evidence of pathologic fracture in the cervical region. Probable pathologic fracture of T4. See results of chest CT. Slightly prominent supraclavicular lymph nodes on each side. No abnormal nodes higher in the neck. Multiple thyroid nodules. Most likely diagnosis is benign goiter, but thyroid ultrasound could be performed for more accurate evaluation if primary carcinoma is not identified elsewhere. In the absence of regional lymph nodes, it would be unusual for this to be thyroid carcinoma. Electronically Signed   By: Nelson Chimes M.D.   On: 10/03/2018 10:55   Ct Chest Wo Contrast  Result Date: 10/03/2018 CLINICAL DATA:  Patient with altered mental status. Lytic lesions within the pelvis. Effusion. EXAM: CT CHEST WITHOUT CONTRAST TECHNIQUE: Multidetector CT imaging of the  chest was performed following the standard protocol without IV contrast. COMPARISON:  CT pelvis 10/02/2018; chest radiograph 10/02/2018 FINDINGS: Cardiovascular: Heart is enlarged. Trace pericardial effusion. Thoracic aortic vascular calcification. Mediastinum/Nodes: Prominent 9 mm left axillary lymph node (image 46; series 2). 2.6 x 3.5 cm left subpectoral lymph node. 1.2 cm right axillary lymph node (image 41; series 2). 1.0 cm node superior mediastinum (image 40; series 2). Normal appearance of the  esophagus. Heterogeneous thyroid. Lungs/Pleura: Central airways are patent. Moderate left and small right pleural effusions. Subpleural consolidation within the left and right lower lobes. No pneumothorax. Upper Abdomen: Small volume perihepatic ascites. Musculoskeletal: Posterior left ninth, tenth and eleventh healing rib fractures. Healing posterior right sixth rib fracture. Multiple healing lateral right rib fractures involving the third and seventh ribs. Mixed lucent and sclerotic lesions involving the T1, T2, T4 and T12 vertebral bodies. There is a mild pathologic fracture of the T4 vertebral body with associated height loss. Prior left mastectomy. Small lucent lesions involving the ribs bilaterally. IMPRESSION: 1. Large left subpectoral lymph node. Additionally there is prominent left axillary and enlarged right axillary adenopathy. Findings are concerning for metastatic disease. 2. Lytic lesions involving the thoracic spine and ribs concerning for osseous metastatic disease. There is mild height loss of the T4 vertebral body most compatible with pathologic fracture. 3. Moderate left and small right layering pleural effusions. There is underlying consolidation within the adjacent pulmonary parenchyma which may represent atelectasis or infection. 4. Aortic Atherosclerosis (ICD10-I70.0). Electronically Signed   By: Lovey Newcomer M.D.   On: 10/03/2018 12:54   Ct Pelvis Wo Contrast  Result Date: 10/02/2018 CLINICAL DATA:  Left sacroiliac joint pain. EXAM: CT PELVIS WITHOUT CONTRAST TECHNIQUE: Multidetector CT imaging of the pelvis was performed following the standard protocol without intravenous contrast. COMPARISON:  CT scan of February 17, 2014. FINDINGS: Urinary Tract: Wall thickening is seen involving superior portion of urinary bladder. Bowel: Sigmoid diverticulosis is noted. There is no evidence of bowel obstruction. Wall thickening of distal sigmoid colon and rectum is noted with surrounding inflammation  consistent with proctocolitis or possibly neoplasm. Vascular/Lymphatic: Atherosclerosis of abdominal aorta is noted. Left inguinal lymph node measuring 1.1 cm is noted. 9 mm left common iliac lymph node is noted. Reproductive: No mass or other significant abnormality. Status post hysterectomy. Other:  No hernia or abnormal fluid collection is noted. Musculoskeletal: Multiple lytic lesions are seen in the pelvis and sacrum, consistent with metastatic disease. Largest lesion is seen posteriorly in right sacrum. IMPRESSION: Multiple lytic lesions are noted in the pelvis and sacrum, with the largest seen posteriorly in right sacrum. These are consistent with metastatic disease or possibly multiple myeloma. Wall thickening of distal sigmoid colon and rectum is noted with surrounding inflammation most consistent with proctocolitis, although neoplasm cannot be excluded. Sigmoidoscopy is recommended for further evaluation. Left inguinal and common iliac adenopathy is noted which may be inflammatory in etiology, but malignancy cannot be excluded. Wall thickening is seen involving the superior portion of urinary bladder; neoplasm cannot be excluded and cystoscopy is recommended. Electronically Signed   By: Marijo Conception, M.D.   On: 10/02/2018 17:06   US Renal  Result Date: 10/05/2018 CLINICAL DATA:  Hydronephrosis. EXAM: RENAL / URINARY TRACT ULTRASOUND COMPLETE COMPARISON:  Ultrasound 10/04/2018. FINDINGS: Right Kidney: Right nephrectomy. Left Kidney: Renal measurements: 12.1 x 6.0 x 6.7 cm = volume: 254.2 cc mL. Echogenicity within normal limits. Mild hydronephrosis. 2.2 cm simple cyst. Bladder: Foley catheter noted bladder.  Bladder is nondistended. Bilateral  pleural effusions noted.  Ascites noted. IMPRESSION: 1. Mild to moderate left hydronephrosis. Similar findings noted on prior exam. 2.  Prior right nephrectomy. Electronically Signed   By: Marcello Moores  Register   On: 10/05/2018 13:40   US Renal  Result Date:  10/03/2018 CLINICAL DATA:  Decreased urine output. History of RIGHT nephrectomy. EXAM: RENAL / URINARY TRACT ULTRASOUND COMPLETE COMPARISON:  CT pelvis dated 10/02/2018. FINDINGS: Right Kidney: Surgically removed. Left Kidney: Renal measurements: 11.9 x 6.2 x 6.2 cm = volume: 240 mL. Mild to moderate hydronephrosis. LEFT renal cyst measures 2.4 cm. Bladder: Appears normal for degree of bladder distention. LEFT ureteral jet is visualized. IMPRESSION: 1. LEFT renal hydronephrosis, mild to moderate in degree. Suspect some degree of associated obstruction at the level of the bladder wall thickening as described on recent pelvis CT of 10/02/2018. However, a LEFT ureteral jet is visualized at the level of the bladder indicating patency of the LEFT UVJ. 2. Status post RIGHT nephrectomy. Electronically Signed   By: Franki Cabot M.D.   On: 10/03/2018 18:54   US Venous Img Lower Bilateral  Result Date: 09/27/2018 CLINICAL DATA:  Bilateral lower extremity edema and pain. EXAM: BILATERAL LOWER EXTREMITY VENOUS DOPPLER ULTRASOUND TECHNIQUE: Gray-scale sonography with graded compression, as well as color Doppler and duplex ultrasound were performed to evaluate the lower extremity deep venous systems from the level of the common femoral vein and including the common femoral, femoral, profunda femoral, popliteal and calf veins including the posterior tibial, peroneal and gastrocnemius veins when visible. The superficial great saphenous vein was also interrogated. Spectral Doppler was utilized to evaluate flow at rest and with distal augmentation maneuvers in the common femoral, femoral and popliteal veins. COMPARISON:  None. FINDINGS: RIGHT LOWER EXTREMITY Common Femoral Vein: No evidence of thrombus. Normal compressibility, respiratory phasicity and response to augmentation. Saphenofemoral Junction: No evidence of thrombus. Normal compressibility and flow on color Doppler imaging. Profunda Femoral Vein: No evidence of  thrombus. Normal compressibility and flow on color Doppler imaging. Femoral Vein: No evidence of thrombus. Normal compressibility, respiratory phasicity and response to augmentation. Popliteal Vein: No evidence of thrombus. Normal compressibility, respiratory phasicity and response to augmentation. Calf Veins: No evidence of thrombus. Normal compressibility and flow on color Doppler imaging. Superficial Great Saphenous Vein: No evidence of thrombus. Normal compressibility. Venous Reflux:  None. Other Findings: No evidence of superficial thrombophlebitis or abnormal fluid collection. LEFT LOWER EXTREMITY Common Femoral Vein: No evidence of thrombus. Normal compressibility, respiratory phasicity and response to augmentation. Saphenofemoral Junction: No evidence of thrombus. Normal compressibility and flow on color Doppler imaging. Profunda Femoral Vein: No evidence of thrombus. Normal compressibility and flow on color Doppler imaging. Femoral Vein: No evidence of thrombus. Normal compressibility, respiratory phasicity and response to augmentation. Popliteal Vein: No evidence of thrombus. Normal compressibility, respiratory phasicity and response to augmentation. Calf Veins: No evidence of thrombus. Normal compressibility and flow on color Doppler imaging. Superficial Great Saphenous Vein: No evidence of thrombus. Normal compressibility. Venous Reflux:  None. Other Findings: No evidence of superficial thrombophlebitis or abnormal fluid collection. IMPRESSION: No evidence of bilateral lower extremity deep venous thrombosis. Electronically Signed   By: Aletta Edouard M.D.   On: 09/27/2018 16:42   Dg Chest Port 1 View  Result Date: 10/06/2018 CLINICAL DATA:  Shortness of breath.  Follow-up exam. EXAM: PORTABLE CHEST 1 VIEW COMPARISON:  10/05/2018 FINDINGS: There is persistent left lung base opacity consistent with combination pleural fluid with atelectasis pneumonia. Mild hazy opacity at the right lung  base is  consistent a small effusion with atelectasis. Are prominent interstitial markings bilaterally without overt pulmonary edema. No new lung abnormalities. No pneumothorax IMPRESSION: No significant change from the prior day's study allowing for differences in patient positioning. Persistent left basilar opacity consistent combination of pleural fluid and atelectasis or pneumonia. Minor right pleural effusion with atelectasis Electronically Signed   By: Lajean Manes M.D.   On: 10/06/2018 08:38   Dg Chest Port 1 View  Result Date: 10/05/2018 CLINICAL DATA:  Shortness of breath EXAM: PORTABLE CHEST 1 VIEW COMPARISON:  10/04/2018 FINDINGS: Cardiac shadow is stable. The lungs are well aerated bilaterally. Small bilateral pleural effusions are again seen and stable. Left basilar atelectatic changes are noted slightly greater than that seen on the prior exam. No pneumothorax is noted. IMPRESSION: Slight increase in left basilar atelectasis. Persistent small pleural effusions. Electronically Signed   By: Inez Catalina M.D.   On: 10/05/2018 08:26   Dg Chest Port 1 View  Result Date: 10/04/2018 CLINICAL DATA:  84 year old female. Pleural effusion. Subsequent encounter. EXAM: PORTABLE CHEST 1 VIEW COMPARISON:  10/03/2018 chest CT and 10/02/2018 chest x-ray. FINDINGS: Rotation to the right. When compared to prior chest x-ray, progressive pulmonary vascular congestion/pulmonary edema. Increase in size of left-sided pleural effusion. Left base consolidation may represent atelectasis or infiltrate. Cardiomegaly. IMPRESSION: 1. Progressive pulmonary vascular congestion/pulmonary edema. 2. Increase in size of left-sided pleural effusion. Left base consolidation may represent atelectasis or infiltrate. 3. Cardiomegaly. Electronically Signed   By: Genia Del M.D.   On: 10/04/2018 07:03   [place] Core Biopsy (lymph Nodes)  Result Date: 10/05/2018 INDICATION: 84 year old female with a history of left breast carcinoma. Likely  metastatic/recurrent disease to the lymph nodes. She presents for biopsy EXAM: ULTRASOUND GUIDED BIOPSY SUBPECTORAL LYMPH NODE MEDICATIONS: None. ANESTHESIA/SEDATION: Moderate (conscious) sedation was employed during this procedure. A total of Versed 0.5 mg and Fentanyl 25 mcg was administered intravenously. Moderate Sedation Time: 10 minutes. The patient's level of consciousness and vital signs were monitored continuously by radiology nursing throughout the procedure under my direct supervision. FLUOROSCOPY TIME:  None COMPLICATIONS: None PROCEDURE: Informed written consent was obtained from the patient after a thorough discussion of the procedural risks, benefits and alternatives. All questions were addressed. Maximal Sterile Barrier Technique was utilized including caps, mask, sterile gowns, sterile gloves, sterile drape, hand hygiene and skin antiseptic. A timeout was performed prior to the initiation of the procedure. Patient positioned supine position on the ultrasound table. Images of the left chest were performed with images stored sent to PACs. The patient is prepped and draped in the usual sterile fashion. 1% lidocaine was used for local anesthesia. Using ultrasound guidance, guide needle was advanced into the subpectoral pathologic lymph node. Multiple 18 gauge core biopsy were performed. Needles removed. Final image was stored. Patient tolerated the procedure well and remained hemodynamically stable throughout. No complications were encountered and no significant blood loss. IMPRESSION: Status post ultrasound-guided biopsy of left subpectoral lymph node. Tissue specimen sent to pathology for complete histopathologic analysis. Signed, Dulcy Fanny. Dellia Nims, RPVI [facility] Radiology Electronically Signed   By: Corrie Mckusick D.O.   On: 10/05/2018 12:59   US Thoracentesis Asp Pleural Space W/img Guide  Result Date: 10/04/2018 INDICATION: Patient with  remote history of breast cancer, lytic bony lesions, adenopathy, bilateral pleural effusions; request received for diagnostic and therapeutic left thoracentesis. EXAM: ULTRASOUND GUIDED DIAGNOSTIC AND THERAPEUTIC LEFT THORACENTESIS MEDICATIONS: None COMPLICATIONS: None immediate. PROCEDURE: An ultrasound  guided thoracentesis was thoroughly discussed with the patient/spouse/daughter and questions answered. The benefits, risks, alternatives and complications were also discussed. The patient understands and wishes to proceed with the procedure. Written consent was obtained. Ultrasound was performed to localize and mark an adequate pocket of fluid in the left chest. The area was then prepped and draped in the normal sterile fashion. 1% Lidocaine was used for local anesthesia. Under ultrasound guidance a 6 Fr Safe-T-Centesis catheter was introduced. Thoracentesis was performed. The catheter was removed and a dressing applied. FINDINGS: A total of approximately 930 cc of slightly hazy, yellow fluid was removed. Samples were sent to the laboratory as requested by the clinical team. IMPRESSION: Successful ultrasound guided diagnostic and therapeutic left thoracentesis yielding 930 cc of pleural fluid. Read by: Rowe Robert, PA-C Electronically Signed   By: Markus Daft M.D.   On: 10/04/2018 13:08    Time Spent in minutes  25   Ottilia Pippenger M.D on 10/06/2018 at 3:51 PM  Between 7am to 7pm - Pager - [phone]  After 7pm go to [email or web address] - password [facility]  [facility] -  Office  [phone]

## 2018-10-07 DIAGNOSIS — N1339 Other hydronephrosis: Secondary | ICD-10-CM

## 2018-10-07 MED ORDER — SODIUM CHLORIDE 0.9 % IV SOLN
INTRAVENOUS | Status: DC | PRN
  Administered 2018-10-07: 1000 mL via INTRAVENOUS

## 2018-10-07 MED ORDER — SENNOSIDES-DOCUSATE SODIUM 8.6-50 MG PO TABS
2.0000 | ORAL_TABLET | Freq: Two times a day (BID) | ORAL | Status: DC
  Administered 2018-10-07 – 2018-10-09 (×3): 2 via ORAL
  Filled 2018-10-07 (×3): qty 2

## 2018-10-07 MED ORDER — MORPHINE SULFATE ER 15 MG PO TBCR
15.0000 mg | EXTENDED_RELEASE_TABLET | Freq: Two times a day (BID) | ORAL | Status: DC
  Administered 2018-10-07: 15 mg via ORAL
  Filled 2018-10-07: qty 1

## 2018-10-07 MED ORDER — ALUM & MAG HYDROXIDE-SIMETH 200-200-20 MG/5ML PO SUSP
30.0000 mL | ORAL | Status: DC | PRN
  Administered 2018-10-07 – 2018-10-08 (×2): 30 mL via ORAL
  Filled 2018-10-07 (×2): qty 30

## 2018-10-07 MED ORDER — POLYETHYLENE GLYCOL 3350 17 G PO PACK
17.0000 g | PACK | Freq: Two times a day (BID) | ORAL | Status: DC
  Administered 2018-10-07 – 2018-10-09 (×3): 17 g via ORAL
  Filled 2018-10-07 (×3): qty 1

## 2018-10-07 MED ORDER — OXYCODONE HCL 5 MG PO TABS
5.0000 mg | ORAL_TABLET | ORAL | Status: DC | PRN
  Administered 2018-10-07 – 2018-10-09 (×3): 10 mg via ORAL
  Filled 2018-10-07 (×4): qty 2

## 2018-10-07 NOTE — Progress Notes (Signed)
Physical Therapy Treatment Patient Details Name: [name] MRN: [id number] DOB: 07/09/1929 Today's Date: 10/07/2018    History of Present Illness 84 yo female admitted for confusion, back pain, and bilateral pleural effusions on 11/9. CT of pelvis reveals multiple lytic lesions to pelvis and sacrum, possible metastatic disease/multiple myeloma. L thoracocentesis 11/11 with 1L fluid removal. PMH includes basal cell carcinoma, breast cancer, barrett esophagus, HTN, IBS, OA, OP, pre-DM.     PT Comments    Pt with improved tolerance for mobility today and had less pain. Pt requires mod-max assist for all mobility at this time due to weakness. Pt able to perform stand pivot transfer to recliner with +2 assist, LE weakness evidenced by pt's RLE buckling while WB. PT with close guarding and correction of buckling during transfer. Pt expressed fear of falling x2 during transfer, but pt safe throughout. Pt to benefit from continued PT in acute setting to address deficits. SNF placement remains appropriate d/c recommendation.    Follow Up Recommendations  Supervision for mobility/OOB;SNF     Equipment Recommendations  None recommended by PT    Recommendations for Other Services       Precautions / Restrictions Precautions Precautions: Fall Restrictions Weight Bearing Restrictions: No    Mobility  Bed Mobility Overal bed mobility: Needs Assistance Bed Mobility: Rolling;Sidelying to Sit Rolling: Mod assist;+2 for safety/equipment Sidelying to sit: HOB elevated;+2 for physical assistance;Mod assist       General bed mobility comments: PT opted for rolling, supine to sit method due to skin tears around sacral region. Pt requires mod assist for rolling to R for LUE reaching across body, knee flexion and translation to R, and pelvic translation. Mod assist for sidelying to sit for trunk elevation, bringing LEs off EOB, and scooting to EOB with bed pad. Pt with R lateral lean upon sitting  EOB, PT provided pelvic facilitation to correct posture. Pt unable to sustain upright posture when facilitation removed.   Transfers Overall transfer level: Needs assistance Equipment used: Rolling walker (2 wheeled) Transfers: Stand Pivot Transfers   Stand pivot transfers: Mod assist;+2 physical assistance;+2 safety/equipment;From elevated surface       General transfer comment: Mod assist +2 for power up, steadying upon standing, turning RW, foot forward progression, knee guarding to prevent knee buckling, and placement in front of recliner prior to sitting. Pt with one episode of R knee buckling with R forward step, PT corrected. Pt made comfortable in chair with pillows under bilateral LEs, behind back, and under calves with heels floating to avoid pressure points.   Ambulation/Gait Ambulation/Gait assistance: (NT - pt too fatigued )               Stairs             Wheelchair Mobility    Modified Rankin (Stroke Patients Only)       Balance Overall balance assessment: Needs assistance Sitting-balance support: Feet supported;Bilateral upper extremity supported Sitting balance-Leahy Scale: Fair Sitting balance - Comments: Pt able to sit unsupported with R lateral lean, PT corrected at pelvic alignment but pt could not sustain this independently. Pt sat EOB 82 ~3 minutes  Postural control: Right lateral lean Standing balance support: Bilateral upper extremity supported;During functional activity Standing balance-Leahy Scale: Poor Standing balance comment: relies on PT, PT aide, and RW for steadying.                             Cognition  Arousal/Alertness: Awake/alert Behavior During Therapy: WFL for tasks assessed/performed Overall Cognitive Status: Impaired/Different from baseline Area of Impairment: Following commands;Problem solving                       Following Commands: Follows one step commands with increased time     Problem  Solving: Decreased initiation;Requires tactile cues;Requires verbal cues        Exercises      General Comments        Pertinent Vitals/Pain Pain Assessment: Faces Faces Pain Scale: Hurts little more Pain Location: back, hips, neck  Pain Descriptors / Indicators: Aching;Sore Pain Intervention(s): Limited activity within patient's tolerance;Repositioned;Monitored during session    Home Living                      Prior Function            PT Goals (current goals can now be found in the care plan section) Acute Rehab PT Goals Patient Stated Goal: none stated  PT Goal Formulation: With patient Time For Goal Achievement: 10/19/18 Potential to Achieve Goals: Good Progress towards PT goals: Progressing toward goals    Frequency    Min 2X/week      PT Plan Current plan remains appropriate    Co-evaluation              AM-PAC PT "6 Clicks" Daily Activity  Outcome Measure  Difficulty turning over in bed (including adjusting bedclothes, sheets and blankets)?: Unable Difficulty moving from lying on back to sitting on the side of the bed? : Unable Difficulty sitting down on and standing up from a chair with arms (e.g., wheelchair, bedside commode, etc,.)?: Unable Help needed moving to and from a bed to chair (including a wheelchair)?: A Lot Help needed walking in hospital room?: A Lot Help needed climbing 3-5 steps with a railing? : Total 6 Click Score: 8    End of Session Equipment Utilized During Treatment: Gait belt Activity Tolerance: Patient limited by pain;Patient limited by fatigue Patient left: with call bell/phone within reach;with family/visitor present;in chair;with chair alarm set Nurse Communication: Mobility status(informed NT that pt will require +2 assist to return to bed) PT Visit Diagnosis: Muscle weakness (generalized) (M62.81);Unsteadiness on feet (R26.81) Pain - Right/Left: (both)     Time: 6742-5525 PT Time Calculation (min)  (ACUTE ONLY): 18 min  Charges:  $Therapeutic Activity: 8-22 mins                     Tracen Mahler Conception Chancy, PT Acute Rehabilitation Services Pager [phone]  Office [phone]    Tyja Gortney D Elonda Husky 10/07/2018, 12:53 PM

## 2018-10-07 NOTE — NC FL2 (Signed)
[place] LEVEL OF CARE SCREENING TOOL     IDENTIFICATION  Patient Name: [name] Birthdate: [birth year] Sex: female Admission Date (Current Location): 10/02/2018  [facility] and [place] Number:  Herbalist and Address:  [facility],  [place] [address], [place]      Provider Number: [id number]  Attending Physician Name and Address:  Louellen Molder, MD  Relative Name and Phone Number:  Shyrl Numbers [phone]    Current Level of Care: Hospital Recommended Level of Care: [place] Prior Approval Number:    Date Approved/Denied:   PASRR Number: [id number]  Discharge Plan: SNF    Current Diagnoses: Patient Active Problem List   Diagnosis Date Noted  . Hypercalcemia 10/02/2018  . Acute metabolic encephalopathy 22/97/9892  . Anemia 10/02/2018  . Leukocytosis 10/02/2018  . Hyponatremia 10/02/2018  . Abnormal LFTs 10/02/2018  . Hypertension 10/02/2018  . Neck pain on left side 10/01/2015    Orientation RESPIRATION BLADDER Height & Weight     Self, Time, Situation, Place  Normal Continent Weight: 112 lb 14.4 oz (51.2 kg) Height:  5\' 3"  (160 cm)  BEHAVIORAL SYMPTOMS/MOOD NEUROLOGICAL BOWEL NUTRITION STATUS      Continent Diet(regular)  AMBULATORY STATUS COMMUNICATION OF NEEDS Skin   Extensive Assist Verbally                         Personal Care Assistance Level of Assistance  Bathing, Feeding, Dressing Bathing Assistance: Limited assistance Feeding assistance: Independent Dressing Assistance: Limited assistance     Functional Limitations Info  Sight, Hearing, Speech Sight Info: Adequate Hearing Info: Adequate Speech Info: Adequate    SPECIAL CARE FACTORS FREQUENCY  PT (By licensed PT), OT (By licensed OT)     PT Frequency: 5x wk OT Frequency: 5x wk            Contractures Contractures Info: Not present    Additional Factors Info  Code Status, Allergies Code Status Info:  DNR Allergies Info: No Known Allergies           Current Medications (10/07/2018):  This is the current hospital active medication list Current Facility-Administered Medications  Medication Dose Route Frequency Provider Last Rate Last Dose  . 0.9 %  sodium chloride infusion   Intravenous PRN Dhungel, Nishant, MD 10 mL/hr at 10/07/18 0617 1,000 mL at 10/07/18 0617  . acetaminophen (TYLENOL) tablet 650 mg  650 mg Oral Q6H PRN Shela Leff, MD   650 mg at 10/07/18 0250   Or  . acetaminophen (TYLENOL) suppository 650 mg  650 mg Rectal Q6H PRN Shela Leff, MD      . aspirin EC tablet 81 mg  81 mg Oral Daily Shela Leff, MD   81 mg at 10/07/18 0906  . enoxaparin (LOVENOX) injection 30 mg  30 mg Subcutaneous QHS Allred, Darrell K, PA-C   30 mg at 10/07/18 0006  . Gerhardt's butt cream   Topical TID Raiford Noble Latif, DO      . hydrALAZINE (APRESOLINE) injection 5 mg  5 mg Intravenous Q4H PRN Shela Leff, MD   5 mg at 10/02/18 2059  . ipratropium-albuterol (DUONEB) 0.5-2.5 (3) MG/3ML nebulizer solution 3 mL  3 mL Nebulization Q4H PRN Dhungel, Nishant, MD      . morphine (MS CONTIN) 12 hr tablet 15 mg  15 mg Oral Q12H Dhungel, Nishant, MD      . ondansetron (ZOFRAN) injection 4 mg  4 mg  Intravenous Q6H PRN Shela Leff, MD   4 mg at 10/03/18 1828  . oxyCODONE (Oxy IR/ROXICODONE) immediate release tablet 5-10 mg  5-10 mg Oral Q4H PRN Dhungel, Nishant, MD   10 mg at 10/07/18 1531  . polyethylene glycol (MIRALAX / GLYCOLAX) packet 17 g  17 g Oral BID Dhungel, Nishant, MD      . polyvinyl alcohol (LIQUIFILM TEARS) 1.4 % ophthalmic solution 1 drop  1 drop Both Eyes PRN Raiford Noble Russell, DO   1 drop at 10/06/18 1702  . predniSONE (DELTASONE) tablet 10 mg  10 mg Oral BID Ladell Pier, MD   10 mg at 10/07/18 0906  . senna-docusate (Senokot-S) tablet 2 tablet  2 tablet Oral BID Dhungel, Nishant, MD         Discharge Medications: Please see discharge summary for a  list of discharge medications.  Relevant Imaging Results:  Relevant Lab Results:   Additional Information SS# [id number]  Wende Neighbors, LCSW

## 2018-10-07 NOTE — Progress Notes (Signed)
IP PROGRESS NOTE  Subjective:   [name] is alert.  Her daughter is at the bedside.  The pain has improved since beginning MS Contin.  She has not had a bowel movement since admission.  Objective: Vital signs in last 24 hours: Blood pressure (!) 143/56, pulse 98, temperature 98.6 F (37 C), temperature source Oral, resp. rate 16, height 5\' 3"  (1.6 m), weight 112 lb 14.4 oz (51.2 kg), SpO2 93 %.  Intake/Output from previous day: 11/13 0701 - 11/14 0700 In: 120 [P.O.:120] Out: 725 [Urine:725]  Physical Exam: Lungs: Good air movement bilaterally, inspiratory rhonchi at the left greater than right upper anterior chest, no respiratory distress Cardiovascular: Regular rate and rhythm Abdomen: Soft and nontender, no hepatosplenomegaly Vascular: No leg edema Neurologic: Alert, follows commands      Lab Results: Recent Labs    10/05/18 0624 10/06/18 0545  WBC 13.0* 14.1*  HGB 9.8* 10.4*  HCT 32.4* 33.6*  PLT 320 387    BMET Recent Labs    10/05/18 0624 10/06/18 0545  NA 132* 134*  K 3.6 4.2  CL 100 100  CO2 22 20*  GLUCOSE 68* 117*  BUN 30* 34*  CREATININE 1.12* 1.05*  CALCIUM 10.6* 10.1    No results found for: CEA1  Studies/Results: US Renal  Result Date: 10/05/2018 CLINICAL DATA:  Hydronephrosis. EXAM: RENAL / URINARY TRACT ULTRASOUND COMPLETE COMPARISON:  Ultrasound 10/04/2018. FINDINGS: Right Kidney: Right nephrectomy. Left Kidney: Renal measurements: 12.1 x 6.0 x 6.7 cm = volume: 254.2 cc mL. Echogenicity within normal limits. Mild hydronephrosis. 2.2 cm simple cyst. Bladder: Foley catheter noted bladder.  Bladder is nondistended. Bilateral pleural effusions noted.  Ascites noted. IMPRESSION: 1. Mild to moderate left hydronephrosis. Similar findings noted on prior exam. 2.  Prior right nephrectomy. Electronically Signed   By: Marcello Moores  Register   On: 10/05/2018 13:40   Dg Chest Port 1 View  Result Date: 10/06/2018 CLINICAL DATA:  Shortness of breath.   Follow-up exam. EXAM: PORTABLE CHEST 1 VIEW COMPARISON:  10/05/2018 FINDINGS: There is persistent left lung base opacity consistent with combination pleural fluid with atelectasis pneumonia. Mild hazy opacity at the right lung base is consistent a small effusion with atelectasis. Are prominent interstitial markings bilaterally without overt pulmonary edema. No new lung abnormalities. No pneumothorax IMPRESSION: No significant change from the prior day's study allowing for differences in patient positioning. Persistent left basilar opacity consistent combination of pleural fluid and atelectasis or pneumonia. Minor right pleural effusion with atelectasis Electronically Signed   By: Lajean Manes M.D.   On: 10/06/2018 08:38   [place] Core Biopsy (lymph Nodes)  Result Date: 10/05/2018 INDICATION: 84 year old female with a history of left breast carcinoma. Likely metastatic/recurrent disease to the lymph nodes. She presents for biopsy EXAM: ULTRASOUND GUIDED BIOPSY SUBPECTORAL LYMPH NODE MEDICATIONS: None. ANESTHESIA/SEDATION: Moderate (conscious) sedation was employed during this procedure. A total of Versed 0.5 mg and Fentanyl 25 mcg was administered intravenously. Moderate Sedation Time: 10 minutes. The patient's level of consciousness and vital signs were monitored continuously by radiology nursing throughout the procedure under my direct supervision. FLUOROSCOPY TIME:  None COMPLICATIONS: None PROCEDURE: Informed written consent was obtained from the patient after a thorough discussion of the procedural risks, benefits and alternatives. All questions were addressed. Maximal Sterile Barrier Technique was utilized including caps, mask, sterile gowns, sterile gloves, sterile drape, hand hygiene and skin antiseptic. A timeout was performed prior to the initiation of the procedure. Patient positioned supine position on the ultrasound table.  Images of the left chest were performed with images stored sent to PACs. The  patient is prepped and draped in the usual sterile fashion. 1% lidocaine was used for local anesthesia. Using ultrasound guidance, guide needle was advanced into the subpectoral pathologic lymph node. Multiple 18 gauge core biopsy were performed. Needles removed. Final image was stored. Patient tolerated the procedure well and remained hemodynamically stable throughout. No complications were encountered and no significant blood loss. IMPRESSION: Status post ultrasound-guided biopsy of left subpectoral lymph node. Tissue specimen sent to pathology for complete histopathologic analysis. Signed, Dulcy Fanny. Dellia Nims, RPVI [facility] Specialists [facility] Radiology Electronically Signed   By: Corrie Mckusick D.O.   On: 10/05/2018 12:59    Medications: I have reviewed the patient's current medications.  Assessment/Plan:  1.  Hypercalcemia-status post Zometa on 10/02/2018, improved 2.  Lytic bone lesions on CT pelvis 10/02/2018 3.  Rectal and bladder wall thickening on CT pelvis 10/02/2018 4.  History of left breast DCIS, status post a left mastectomy in 2001 5.  Altered mental status secondary to #1 6.  History of Barrett's esophagus 7.  Osteoporosis 8.  Nephrectomy in 1955 9.  Pain secondary to lytic bone lesions 10. Left neck mass 11. Anemia 12.  Bilateral pleural effusions-status post a left thoracentesis 10/04/2018  [name] appears unchanged.  The pain is under better control with MS Contin and oxycodone.  She is starting MiraLAX and Senokot for constipation. I discussed the case with Dr. Melina Copa this morning.  She indicates the pathology from the subpectoral lymph node biopsy is consistent with carcinoma.  No evidence of lymphoma.  Immunohistochemical stains should be out later today to help define a primary tumor site.  The tumor cells appear poorly differentiated.  Recommendations: 1.  Continue prednisone and MS Contin/oxycodone for pain 2.  Care management consult  for skilled nursing facility placement  3.  Referral to [facility] for outpatient palliative care 4.  I will follow-up on the pathology result and communicate the findings to her family    LOS: 5 days   Betsy Coder, MD   10/07/2018, 8:15 AM

## 2018-10-07 NOTE — Care Management Note (Signed)
Case Management Note  Patient Details  Name: [name] MRN: [id number] Date of Birth: [birth year]  Subjective/Objective:                  Pna, wbc=14.1, bun 39, creat.-1.05  Action/Plan: Lives at home with spouse Following for progression of care. Following for cm needs none present at this time. Expected Discharge Date:                  Expected Discharge Plan:  Home/Self Care  In-House Referral:     Discharge planning Services  CM Consult  Post Acute Care Choice:    Choice offered to:     DME Arranged:    DME Agency:     HH Arranged:    HH Agency:     Status of Service:  In process, will continue to follow  If discussed at Long Length of Stay Meetings, dates discussed:    Additional Comments:  Leeroy Cha, RN 10/07/2018, 11:26 AM

## 2018-10-07 NOTE — Progress Notes (Signed)
Daily Progress Note   Patient Name: [name]       Date: 10/07/2018 DOB: Nov 11, 1929  Age: 84 y.o. MRN#: [id number] Attending Physician: Louellen Molder, MD Primary Care Physician: Janie Morning, DO Admit Date: 10/02/2018  Reason for Consultation/Follow-up: Establishing goals of care  Subjective:  patient is more awake, alert Resting in bed Slept ok last night, one of her daughters spent the night with her Another daughter ( who is visiting from [place]) and patient's husband are at the bedside.  Patient states that overall, her back pain, her pain in both legs is manageable. She ate grits and drank ensure, ate about 40% of her breakfast tray.    See below   Length of Stay: 5  Current Medications: Scheduled Meds:  . aspirin EC  81 mg Oral Daily  . enoxaparin (LOVENOX) injection  30 mg Subcutaneous QHS  . Gerhardt's butt cream   Topical TID  . morphine  15 mg Oral q morning - 10a  . polyethylene glycol  17 g Oral Daily  . predniSONE  10 mg Oral BID  . senna  1 tablet Oral Daily    Continuous Infusions: . sodium chloride 1,000 mL (10/07/18 0617)    PRN Meds: sodium chloride, acetaminophen **OR** acetaminophen, hydrALAZINE, ipratropium-albuterol, ondansetron (ZOFRAN) IV, oxyCODONE, polyvinyl alcohol  Physical Exam         Patient is awake alert Resting in bed Non focal Appears weak, chronically ill Regular S 1 S 2  Abdomen is not distended Back pain +  No edema  Vital Signs: BP (!) 143/56 (BP Location: Left Arm)   Pulse 98   Temp 98.6 F (37 C) (Oral)   Resp 16   Ht 5' 3" (1.6 m)   Wt 51.2 kg   SpO2 93%   BMI 20.00 kg/m  SpO2: SpO2: 93 % O2 Device: O2 Device: Room Air O2 Flow Rate: O2 Flow Rate (L/min): 2 L/min  Intake/output summary:    Intake/Output Summary (Last 24 hours) at 10/07/2018 0908 Last data filed at 10/06/2018 2345 Gross per 24 hour  Intake 120 ml  Output 725 ml  Net -605 ml   LBM: Last BM Date: 10/01/18 Baseline Weight: Weight: 53.5 kg Most recent weight: Weight: 51.2 kg       Palliative Assessment/Data: PPS 40%  Patient Active Problem List   Diagnosis Date Noted  . Hypercalcemia 10/02/2018  . Acute metabolic encephalopathy 88/89/1694  . Anemia 10/02/2018  . Leukocytosis 10/02/2018  . Hyponatremia 10/02/2018  . Abnormal LFTs 10/02/2018  . Hypertension 10/02/2018  . Neck pain on left side 10/01/2015    Palliative Care Assessment & Plan   Patient Profile:    Assessment:  84 yo admitted with confusion, hypercalcemia, lytic bone lesions, rectal and bladder wall thickening. She has a history of breast cancer, history of nephrectomy in the past. She has pain in her back and legs. She has undergone lymph node biopsy, she has thoracentesis with path reports now available.   Recommendations/Plan:   I met with the patient and her family, agree with and appreciate med onc input and their pain and non pain symptom management recommendations. Await further input regarding path results from lymph node biopsy.    Patient to continue current pain and bowel regimen  CSW consulted to help facilitate SNF rehab with palliative care to follow, patient will need hospice support after rehab trial is completed. Family requests that CSW coordinate a time for meeting with family, when both of her daughters are present.   Patient will need residential hospice, should she have further accelerated decline in the near future. Gently discussed with family at bedside, about hospice philosophy of care.   Repeat PT evaluation, as requested by patient/family.     Code Status:    Code Status Orders  (From admission, onward)         Start     Ordered   10/02/18 2039  Do not attempt resuscitation (DNR)   Continuous    Question Answer Comment  In the event of cardiac or respiratory ARREST Do not call a "code blue"   In the event of cardiac or respiratory ARREST Do not perform Intubation, CPR, defibrillation or ACLS   In the event of cardiac or respiratory ARREST Use medication by any route, position, wound care, and other measures to relive pain and suffering. May use oxygen, suction and manual treatment of airway obstruction as needed for comfort.      10/02/18 2038        Code Status History    Date Active Date Inactive Code Status Order ID Comments User Context   10/02/2018 2016 10/02/2018 2038 Full Code [id number]  Shela Leff, MD ED    Advance Directive Documentation     Most Recent Value  Type of Advance Directive  Healthcare Power of Attorney, Living will  Pre-existing out of facility DNR order (yellow form or pink MOST form)  -  "MOST" Form in Place?  -       Prognosis:   guarded   Discharge Planning:  [place] for rehab with Palliative care service follow-up  Care plan was discussed with  Patient, daughter, husband.  Consult placed for CSW for facilitating SNF rehab options with palliative care to follow, in the out patient setting. Patient doesn't appear to be residential hospice at the moment, in my opinion. She would like to continue to participate in PT and consider SNF at this stage.   Thank you for allowing the Palliative Medicine Team to assist in the care of this patient.   Time In: 8.30 Time Out: 9.05 Total Time 35 Prolonged Time Billed no      Greater than 50%  of this time was spent counseling and coordinating care related to the above assessment and plan.  Loistine Chance,  MD [phone] Please contact Palliative Medicine Team phone at [phone] for questions and concerns.

## 2018-10-07 NOTE — Clinical Social Work Note (Signed)
Clinical Social Work Assessment  Patient Details  Name: [name] MRN: [id number] Date of Birth: 01/11/1929  Date of referral:  10/07/18               Reason for consult:  Discharge Planning, Facility Placement                Permission sought to share information with:  Family Supports Permission granted to share information::  Yes, Verbal Permission Granted  Name::     Shyrl Numbers  Agency::     Relationship::  daughter  Contact Information:  [phone]  Housing/Transportation Living arrangements for the past 2 months:  Single Family Home Source of Information:  Patient Patient Interpreter Needed:  None Criminal Activity/Legal Involvement Pertinent to Current Situation/Hospitalization:  No - Comment as needed Significant Relationships:  Adult Children, Spouse Lives with:  Spouse Do you feel safe going back to the place where you live?  Yes Need for family participation in patient care:  Yes (Comment)  Care giving concerns:  Patients spouse and two daughters at bedside. Patient lives at home with spouse and has one adult daughter in the area for support.   Social Worker assessment / plan:  CSW met patient at bedside with family at bedside to offer support and talk discharge plans. Family agreeable for patient to go to a rehab facility. CSW went over the process of SNF and family stated they understood the process  Employment status:  Retired Nurse, adult PT Recommendations:  [place] / Referral to community resources:  [place]  Patient/Family's Response to care:  Family appreciative of care patient has receive at [facility] of and Emotional Response to Diagnosis, Current Treatment, and Prognosis:  Family agreeable for patient to go to rehab   Emotional Assessment Appearance:  Appears stated age Attitude/Demeanor/Rapport:  Engaged Affect (typically observed):   Accepting Orientation:  Oriented to Self, Oriented to Place, Oriented to  Time, Oriented to Situation Alcohol / Substance use:  Not Applicable Psych involvement (Current and /or in the community):  No (Comment)  Discharge Needs  Concerns to be addressed:  Care Coordination Readmission within the last 30 days:  No Current discharge risk:  Dependent with Mobility Barriers to Discharge:  Continued Medical Work up, [place], LCSW 10/07/2018, 4:43 PM

## 2018-10-07 NOTE — Progress Notes (Signed)
PROGRESS NOTE                                                                                                                                                                                                             Patient Demographics:    [name], is a 84 y.o. female, DOB - 06-20-1929, SWH:[id number]  Admit date - 10/02/2018   Admitting Physician Shela Leff, MD  Outpatient Primary MD for the patient is Janie Morning, DO  LOS - 5  Outpatient Specialists: Dr. Learta Codding  Chief Complaint  Patient presents with  . Altered Mental Status       Brief Narrative   84 year old female with history of breast cancer, skin cancer, hypertension, hyperlipidemia presented with altered mental status as per her family.  Also complained of nausea but no vomiting. In the ED vitals were stable with mildly elevated WBC of 13 K, corrected calcium of 14.9 and chest x-ray showing mild left pleural effusion.  Head CT unremarkable while CT of the pelvis showed multiple lytic lesion in the sacrum and the pelvis (largest seen posteriorly in the right sacrum).  This was suspicious of metastatic disease versus possible multiple myeloma. Admitted for IV hydration and further work-up and biopsy of the left subpectoral lymph node..    Subjective:   Still having a lot of back pain.  Has not had a bowel movement   Assessment  & Plan :    Principal Problem:   Hypercalcemia of malignancy Pleural fluid cytology showing lymphoid cells.  Lymph node biopsy pending.  Oncology discussed with the pathologist who informed that histology was concerning for lymphoma.  Final report should be available today. Dr. Learta Codding discussed treatment options with patient and her family who wish to pursue comfort care and refused against aggressive treatment. Calcium level normal. Added prednisone and MS Contin for pain.  MS Contin dose increased to twice daily  and added oxycodone as needed for breakthrough pain. Social work consulted for SNF with palliative care follow-up.     Active Problems:   Acute metabolic encephalopathy Secondary to severe hypercalcemia.  Now resolved.  No signs of infection.  Head CT unremarkable.   Left pleural effusion Underwent thoracentesis.  Cytology from the pleural fluid showing lymphoid cells.  Normocytic anemia Stable.  No need for transfusion.  Leukocytosis?  Mild proctocolitis on CT.  No clinical signs of infection.  No antibiotics given.  Hyponatremia Mild.  Resolved with fluids.  Multiple thyroid nodules Check TSH/free T4.  Perineal area and buttocks skin tear Wound care consult appreciated.Gerhart's Butt cream applied three times daily (zinc oxide/hydrocortisone cream/lotrimin cream).  Left hydronephrosis Seen in CT.  Had poor urine output during hospital stay.  Foley placed and discussed with urology who recommend to continue Foley placement.  Has had good urine output in the past 24 hours.    Code Status : DNR  Family Communication  : Husband and daughter at bedside  Disposition Plan  : SNF with palliative care follow-up.  Barriers For Discharge : Improving symptoms  Consults  : Oncology, palliative care  Procedures  : CT abdomen, ultrasound-guided biopsy of left subpectoral lymph node and left pleural fluid  DVT Prophylaxis  :  Lovenox -  Lab Results  Component Value Date   PLT 387 10/06/2018    Antibiotics    Anti-infectives (From admission, onward)   None        Objective:   Vitals:   10/06/18 0843 10/06/18 1326 10/07/18 0638 10/07/18 1444  BP:  (!) 134/53 (!) 143/56 (!) 151/63  Pulse:  100 98 83  Resp: '16 18 16 16  '$ Temp:  98.2 F (36.8 C) 98.6 F (37 C) 97.8 F (36.6 C)  TempSrc:   Oral Oral  SpO2: 93% 97% 93% 94%  Weight:   51.2 kg   Height:        Wt Readings from Last 3 Encounters:  10/07/18 51.2 kg  12/05/15 61.2 kg  10/01/15 61.7 kg      Intake/Output Summary (Last 24 hours) at 10/07/2018 1457 Last data filed at 10/07/2018 1225 Gross per 24 hour  Intake 300 ml  Output 475 ml  Net -175 ml    Physical exam Fatigued HEENT: Moist mucosa, supple neck Chest: Clear bilaterally CVs: Normal S1-S2, no murmurs GI: Soft, nondistended, nontender, bowel sounds present, Foley + Musculoskeletal: Warm, no edema     Data Review:    CBC Recent Labs  Lab 10/02/18 1529 10/03/18 0542 10/04/18 0549 10/05/18 0624 10/06/18 0545  WBC 12.9* 11.4* 14.0* 13.0* 14.1*  HGB 10.5* 10.1* 9.5* 9.8* 10.4*  HCT 34.0* 32.0* 31.5* 32.4* 33.6*  PLT 323 320 300 320 387  MCV 84.8 84.4 85.1 85.3 84.6  MCH 26.2 26.6 25.7* 25.8* 26.2  MCHC 30.9 31.6 30.2 30.2 31.0  RDW 16.2* 16.4* 16.0* 16.2* 16.1*  LYMPHSABS 1.3  --  0.8 0.8 0.7  MONOABS 1.0  --  0.9 0.8 0.4  EOSABS 0.2  --  0.1 0.2 0.0  BASOSABS 0.1  --  0.1 0.1 0.1    Chemistries  Recent Labs  Lab 10/02/18 1529 10/03/18 0542 10/03/18 1606 10/04/18 0549 10/05/18 0624 10/06/18 0545  NA 132* 132* 132* 132* 132* 134*  K 3.6 3.5 3.5 3.8 3.6 4.2  CL 91* 96* 98 101 100 100  CO2 '31 27 26 22 22 '$ 20*  GLUCOSE 115* 108* 96 85 68* 117*  BUN 27* 25* 27* 26* 30* 34*  CREATININE 0.97 0.97 1.04* 1.13* 1.12* 1.05*  CALCIUM 14.3* 13.3* 12.6* 11.5* 10.6* 10.1  MG  --   --   --  1.7 1.7 2.2  AST 69* 64*  --  62* 63* 69*  ALT 24 24  --  28 26 33  ALKPHOS 124 104  --  93 93 109  BILITOT 0.8 0.6  --  0.5 0.5 0.9   ------------------------------------------------------------------------------------------------------------------  No results for input(s): CHOL, HDL, LDLCALC, TRIG, CHOLHDL, LDLDIRECT in the last 72 hours.  No results found for: HGBA1C ------------------------------------------------------------------------------------------------------------------ Recent Labs    10/06/18 1647  TSH 1.999    ------------------------------------------------------------------------------------------------------------------ No results for input(s): VITAMINB12, FOLATE, FERRITIN, TIBC, IRON, RETICCTPCT in the last 72 hours.  Coagulation profile Recent Labs  Lab 10/05/18 0624  INR 1.10    No results for input(s): DDIMER in the last 72 hours.  Cardiac Enzymes No results for input(s): CKMB, TROPONINI, MYOGLOBIN in the last 168 hours.  Invalid input(s): CK ------------------------------------------------------------------------------------------------------------------    Component Value Date/Time   BNP 243.6 (H) 10/04/2018 0549    Inpatient Medications  Scheduled Meds: . aspirin EC  81 mg Oral Daily  . enoxaparin (LOVENOX) injection  30 mg Subcutaneous QHS  . Gerhardt's butt cream   Topical TID  . morphine  15 mg Oral Q12H  . polyethylene glycol  17 g Oral BID  . predniSONE  10 mg Oral BID  . senna-docusate  2 tablet Oral BID   Continuous Infusions: . sodium chloride 1,000 mL (10/07/18 0617)   PRN Meds:.sodium chloride, acetaminophen **OR** acetaminophen, hydrALAZINE, ipratropium-albuterol, ondansetron (ZOFRAN) IV, oxyCODONE, polyvinyl alcohol  Micro Results Recent Results (from the past 240 hour(s))  Urine Culture     Status: None   Collection Time: 10/02/18  7:20 PM  Result Value Ref Range Status   Specimen Description   Final    URINE, CLEAN CATCH Performed at [facility], [place] [address] [place], [place] [address]    Special Requests   Final    NONE Performed at [facility], [place] [address], [place], [place] [address]    Culture   Final    NO GROWTH Performed at [facility], [place] [address], [place], [place] [address]    Report Status 10/04/2018 FINAL  Final  Acid Fast Smear (AFB)     Status: None   Collection Time: 10/04/18  1:01 PM  Result Value Ref Range Status   AFB Specimen Processing Concentration  Final   Acid  Fast Smear Negative  Final    Comment: (NOTE) Performed At: [facility] [place], [place] [id number] Rush Farmer MD [id number]    Source (AFB) PLEURAL  Final    Comment: LEFT Performed at [facility], [place] [address] [place],  [address]   Culture, body fluid-bottle     Status: None (Preliminary result)   Collection Time: 10/04/18  1:01 PM  Result Value Ref Range Status   Specimen Description PLEURAL  Final   Special Requests NONE  Final   Culture   Final    NO GROWTH 3 DAYS Performed at [place] [address], [place],  [address]    Report Status PENDING  Incomplete  Gram stain     Status: None   Collection Time: 10/04/18  1:01 PM  Result Value Ref Range Status   Specimen Description PLEURAL  Final   Special Requests NONE  Final   Gram Stain   Final    WBC PRESENT,BOTH PMN AND MONONUCLEAR NO ORGANISMS SEEN CYTOSPIN SMEAR Performed at [facility], [address] [place],  [address]    Report Status 10/04/2018 FINAL  Final    Radiology Reports Dg Chest 1 View  Result Date: 10/04/2018 CLINICAL DATA:  Status post left thoracentesis. EXAM: CHEST  1 VIEW COMPARISON:  Radiograph of October 04, 2018. FINDINGS: Stable cardiomediastinal silhouette.  No pneumothorax is noted. Left pleural effusion noted on prior exam is significantly decreased. Mild right basilar atelectasis and effusion is noted. Residual left basilar subsegmental atelectasis is noted. Bony thorax is unremarkable. IMPRESSION: Significantly decreased left pleural effusion status post thoracentesis. No pneumothorax is noted. Electronically Signed   By: Marijo Conception, M.D.   On: 10/04/2018 12:40   Dg Chest 2 View  Result Date: 10/02/2018 CLINICAL DATA:  Chest pain. EXAM: CHEST - 2 VIEW COMPARISON:  Radiographs of September 07, 2018. FINDINGS: Stable cardiomediastinal silhouette. No pneumothorax is noted. Mild left pleural effusion  is noted with probable underlying atelectasis or infiltrate. Minimal right pleural effusion is noted. Bony thorax is unremarkable. IMPRESSION: Mild left pleural effusion is noted with probable underlying atelectasis or infiltrate. Minimal right pleural effusion. Electronically Signed   By: Marijo Conception, M.D.   On: 10/02/2018 16:22   Ct Head Wo Contrast  Result Date: 10/02/2018 CLINICAL DATA:  Patient with chest pain.  Nausea. EXAM: CT HEAD WITHOUT CONTRAST TECHNIQUE: Contiguous axial images were obtained from the base of the skull through the vertex without intravenous contrast. COMPARISON:  None. FINDINGS: Brain: Ventricles and sulci are appropriate for patient's age. No evidence for acute cortically based infarct, intracranial hemorrhage, mass lesion or mass-effect. Vascular: Unremarkable Skull: Intact. Sinuses/Orbits: Paranasal sinuses well aerated. Mastoid air cells unremarkable. Orbits unremarkable. Other: None. IMPRESSION: No acute intracranial process. Electronically Signed   By: Lovey Newcomer M.D.   On: 10/02/2018 16:59   Ct Soft Tissue Neck Wo Contrast  Result Date: 10/03/2018 CLINICAL DATA:  Metastatic cancer. EXAM: CT NECK WITHOUT CONTRAST TECHNIQUE: Multidetector CT imaging of the neck was performed following the standard protocol without intravenous contrast. COMPARISON:  None. FINDINGS: Pharynx and larynx: No mucosal or submucosal lesion. Salivary glands: Parotid and submandibular glands are normal. Thyroid: Multiple thyroid nodules. This could be benign goiter. Consider thyroid ultrasound for more accurate evaluation. Lymph nodes: No enlarged or low-density nodes in the neck region. There slightly prominent nodes in the supraclavicular fat on both sides. These are nonspecific. They could represent reactive nodes or malignancy. Vascular: Ordinary atherosclerotic calcification of the carotid bifurcation regions. Limited intracranial: Negative Visualized orbits: Normal Mastoids and visualized  paranasal sinuses: Clear Skeleton: Lytic foci scattered throughout the cervical and thoracic spine consistent with osseous metastatic disease. No evidence of pathologic fracture in the cervical region. Possible pathologic fracture at T4. No discernible tumor involvement of the canal, but CT is not sensitive. Upper chest: See results of chest CT Other: None IMPRESSION: Lytic metastatic lesions affecting the cervical and upper thoracic spine. No evidence of pathologic fracture in the cervical region. Probable pathologic fracture of T4. See results of chest CT. Slightly prominent supraclavicular lymph nodes on each side. No abnormal nodes higher in the neck. Multiple thyroid nodules. Most likely diagnosis is benign goiter, but thyroid ultrasound could be performed for more accurate evaluation if primary carcinoma is not identified elsewhere. In the absence of regional lymph nodes, it would be unusual for this to be thyroid carcinoma. Electronically Signed   By: Nelson Chimes M.D.   On: 10/03/2018 10:55   Ct Chest Wo Contrast  Result Date: 10/03/2018 CLINICAL DATA:  Patient with altered mental status. Lytic lesions within the pelvis. Effusion. EXAM: CT CHEST WITHOUT CONTRAST TECHNIQUE: Multidetector CT imaging of the chest was performed following the standard protocol without IV contrast. COMPARISON:  CT pelvis 10/02/2018; chest radiograph 10/02/2018 FINDINGS: Cardiovascular: Heart is enlarged. Trace pericardial effusion. Thoracic aortic vascular calcification.  Mediastinum/Nodes: Prominent 9 mm left axillary lymph node (image 46; series 2). 2.6 x 3.5 cm left subpectoral lymph node. 1.2 cm right axillary lymph node (image 41; series 2). 1.0 cm node superior mediastinum (image 40; series 2). Normal appearance of the esophagus. Heterogeneous thyroid. Lungs/Pleura: Central airways are patent. Moderate left and small right pleural effusions. Subpleural consolidation within the left and right lower lobes. No pneumothorax.  Upper Abdomen: Small volume perihepatic ascites. Musculoskeletal: Posterior left ninth, tenth and eleventh healing rib fractures. Healing posterior right sixth rib fracture. Multiple healing lateral right rib fractures involving the third and seventh ribs. Mixed lucent and sclerotic lesions involving the T1, T2, T4 and T12 vertebral bodies. There is a mild pathologic fracture of the T4 vertebral body with associated height loss. Prior left mastectomy. Small lucent lesions involving the ribs bilaterally. IMPRESSION: 1. Large left subpectoral lymph node. Additionally there is prominent left axillary and enlarged right axillary adenopathy. Findings are concerning for metastatic disease. 2. Lytic lesions involving the thoracic spine and ribs concerning for osseous metastatic disease. There is mild height loss of the T4 vertebral body most compatible with pathologic fracture. 3. Moderate left and small right layering pleural effusions. There is underlying consolidation within the adjacent pulmonary parenchyma which may represent atelectasis or infection. 4. Aortic Atherosclerosis (ICD10-I70.0). Electronically Signed   By: Lovey Newcomer M.D.   On: 10/03/2018 12:54   Ct Pelvis Wo Contrast  Result Date: 10/02/2018 CLINICAL DATA:  Left sacroiliac joint pain. EXAM: CT PELVIS WITHOUT CONTRAST TECHNIQUE: Multidetector CT imaging of the pelvis was performed following the standard protocol without intravenous contrast. COMPARISON:  CT scan of February 17, 2014. FINDINGS: Urinary Tract: Wall thickening is seen involving superior portion of urinary bladder. Bowel: Sigmoid diverticulosis is noted. There is no evidence of bowel obstruction. Wall thickening of distal sigmoid colon and rectum is noted with surrounding inflammation consistent with proctocolitis or possibly neoplasm. Vascular/Lymphatic: Atherosclerosis of abdominal aorta is noted. Left inguinal lymph node measuring 1.1 cm is noted. 9 mm left common iliac lymph node is  noted. Reproductive: No mass or other significant abnormality. Status post hysterectomy. Other:  No hernia or abnormal fluid collection is noted. Musculoskeletal: Multiple lytic lesions are seen in the pelvis and sacrum, consistent with metastatic disease. Largest lesion is seen posteriorly in right sacrum. IMPRESSION: Multiple lytic lesions are noted in the pelvis and sacrum, with the largest seen posteriorly in right sacrum. These are consistent with metastatic disease or possibly multiple myeloma. Wall thickening of distal sigmoid colon and rectum is noted with surrounding inflammation most consistent with proctocolitis, although neoplasm cannot be excluded. Sigmoidoscopy is recommended for further evaluation. Left inguinal and common iliac adenopathy is noted which may be inflammatory in etiology, but malignancy cannot be excluded. Wall thickening is seen involving the superior portion of urinary bladder; neoplasm cannot be excluded and cystoscopy is recommended. Electronically Signed   By: Marijo Conception, M.D.   On: 10/02/2018 17:06   US Renal  Result Date: 10/05/2018 CLINICAL DATA:  Hydronephrosis. EXAM: RENAL / URINARY TRACT ULTRASOUND COMPLETE COMPARISON:  Ultrasound 10/04/2018. FINDINGS: Right Kidney: Right nephrectomy. Left Kidney: Renal measurements: 12.1 x 6.0 x 6.7 cm = volume: 254.2 cc mL. Echogenicity within normal limits. Mild hydronephrosis. 2.2 cm simple cyst. Bladder: Foley catheter noted bladder.  Bladder is nondistended. Bilateral pleural effusions noted.  Ascites noted. IMPRESSION: 1. Mild to moderate left hydronephrosis. Similar findings noted on prior exam. 2.  Prior right nephrectomy. Electronically Signed   By: Marcello Moores  Register   On: 10/05/2018 13:40   US Renal  Result Date: 10/03/2018 CLINICAL DATA:  Decreased urine output. History of RIGHT nephrectomy. EXAM: RENAL / URINARY TRACT ULTRASOUND COMPLETE COMPARISON:  CT pelvis dated 10/02/2018. FINDINGS: Right Kidney: Surgically  removed. Left Kidney: Renal measurements: 11.9 x 6.2 x 6.2 cm = volume: 240 mL. Mild to moderate hydronephrosis. LEFT renal cyst measures 2.4 cm. Bladder: Appears normal for degree of bladder distention. LEFT ureteral jet is visualized. IMPRESSION: 1. LEFT renal hydronephrosis, mild to moderate in degree. Suspect some degree of associated obstruction at the level of the bladder wall thickening as described on recent pelvis CT of 10/02/2018. However, a LEFT ureteral jet is visualized at the level of the bladder indicating patency of the LEFT UVJ. 2. Status post RIGHT nephrectomy. Electronically Signed   By: Franki Cabot M.D.   On: 10/03/2018 18:54   US Venous Img Lower Bilateral  Result Date: 09/27/2018 CLINICAL DATA:  Bilateral lower extremity edema and pain. EXAM: BILATERAL LOWER EXTREMITY VENOUS DOPPLER ULTRASOUND TECHNIQUE: Gray-scale sonography with graded compression, as well as color Doppler and duplex ultrasound were performed to evaluate the lower extremity deep venous systems from the level of the common femoral vein and including the common femoral, femoral, profunda femoral, popliteal and calf veins including the posterior tibial, peroneal and gastrocnemius veins when visible. The superficial great saphenous vein was also interrogated. Spectral Doppler was utilized to evaluate flow at rest and with distal augmentation maneuvers in the common femoral, femoral and popliteal veins. COMPARISON:  None. FINDINGS: RIGHT LOWER EXTREMITY Common Femoral Vein: No evidence of thrombus. Normal compressibility, respiratory phasicity and response to augmentation. Saphenofemoral Junction: No evidence of thrombus. Normal compressibility and flow on color Doppler imaging. Profunda Femoral Vein: No evidence of thrombus. Normal compressibility and flow on color Doppler imaging. Femoral Vein: No evidence of thrombus. Normal compressibility, respiratory phasicity and response to augmentation. Popliteal Vein: No evidence  of thrombus. Normal compressibility, respiratory phasicity and response to augmentation. Calf Veins: No evidence of thrombus. Normal compressibility and flow on color Doppler imaging. Superficial Great Saphenous Vein: No evidence of thrombus. Normal compressibility. Venous Reflux:  None. Other Findings: No evidence of superficial thrombophlebitis or abnormal fluid collection. LEFT LOWER EXTREMITY Common Femoral Vein: No evidence of thrombus. Normal compressibility, respiratory phasicity and response to augmentation. Saphenofemoral Junction: No evidence of thrombus. Normal compressibility and flow on color Doppler imaging. Profunda Femoral Vein: No evidence of thrombus. Normal compressibility and flow on color Doppler imaging. Femoral Vein: No evidence of thrombus. Normal compressibility, respiratory phasicity and response to augmentation. Popliteal Vein: No evidence of thrombus. Normal compressibility, respiratory phasicity and response to augmentation. Calf Veins: No evidence of thrombus. Normal compressibility and flow on color Doppler imaging. Superficial Great Saphenous Vein: No evidence of thrombus. Normal compressibility. Venous Reflux:  None. Other Findings: No evidence of superficial thrombophlebitis or abnormal fluid collection. IMPRESSION: No evidence of bilateral lower extremity deep venous thrombosis. Electronically Signed   By: Aletta Edouard M.D.   On: 09/27/2018 16:42   Dg Chest Port 1 View  Result Date: 10/06/2018 CLINICAL DATA:  Shortness of breath.  Follow-up exam. EXAM: PORTABLE CHEST 1 VIEW COMPARISON:  10/05/2018 FINDINGS: There is persistent left lung base opacity consistent with combination pleural fluid with atelectasis pneumonia. Mild hazy opacity at the right lung base is consistent a small effusion with atelectasis. Are prominent interstitial markings bilaterally without overt pulmonary edema. No new lung abnormalities. No pneumothorax IMPRESSION: No significant change from the prior  day's study allowing for differences in patient positioning. Persistent left basilar opacity consistent combination of pleural fluid and atelectasis or pneumonia. Minor right pleural effusion with atelectasis Electronically Signed   By: Lajean Manes M.D.   On: 10/06/2018 08:38   Dg Chest Port 1 View  Result Date: 10/05/2018 CLINICAL DATA:  Shortness of breath EXAM: PORTABLE CHEST 1 VIEW COMPARISON:  10/04/2018 FINDINGS: Cardiac shadow is stable. The lungs are well aerated bilaterally. Small bilateral pleural effusions are again seen and stable. Left basilar atelectatic changes are noted slightly greater than that seen on the prior exam. No pneumothorax is noted. IMPRESSION: Slight increase in left basilar atelectasis. Persistent small pleural effusions. Electronically Signed   By: Inez Catalina M.D.   On: 10/05/2018 08:26   Dg Chest Port 1 View  Result Date: 10/04/2018 CLINICAL DATA:  84 year old female. Pleural effusion. Subsequent encounter. EXAM: PORTABLE CHEST 1 VIEW COMPARISON:  10/03/2018 chest CT and 10/02/2018 chest x-ray. FINDINGS: Rotation to the right. When compared to prior chest x-ray, progressive pulmonary vascular congestion/pulmonary edema. Increase in size of left-sided pleural effusion. Left base consolidation may represent atelectasis or infiltrate. Cardiomegaly. IMPRESSION: 1. Progressive pulmonary vascular congestion/pulmonary edema. 2. Increase in size of left-sided pleural effusion. Left base consolidation may represent atelectasis or infiltrate. 3. Cardiomegaly. Electronically Signed   By: Genia Del M.D.   On: 10/04/2018 07:03   [place] Core Biopsy (lymph Nodes)  Result Date: 10/05/2018 INDICATION: 84 year old female with a history of left breast carcinoma. Likely metastatic/recurrent disease to the lymph nodes. She presents for biopsy EXAM: ULTRASOUND GUIDED BIOPSY SUBPECTORAL LYMPH NODE MEDICATIONS: None. ANESTHESIA/SEDATION: Moderate (conscious) sedation was employed during  this procedure. A total of Versed 0.5 mg and Fentanyl 25 mcg was administered intravenously. Moderate Sedation Time: 10 minutes. The patient's level of consciousness and vital signs were monitored continuously by radiology nursing throughout the procedure under my direct supervision. FLUOROSCOPY TIME:  None COMPLICATIONS: None PROCEDURE: Informed written consent was obtained from the patient after a thorough discussion of the procedural risks, benefits and alternatives. All questions were addressed. Maximal Sterile Barrier Technique was utilized including caps, mask, sterile gowns, sterile gloves, sterile drape, hand hygiene and skin antiseptic. A timeout was performed prior to the initiation of the procedure. Patient positioned supine position on the ultrasound table. Images of the left chest were performed with images stored sent to PACs. The patient is prepped and draped in the usual sterile fashion. 1% lidocaine was used for local anesthesia. Using ultrasound guidance, guide needle was advanced into the subpectoral pathologic lymph node. Multiple 18 gauge core biopsy were performed. Needles removed. Final image was stored. Patient tolerated the procedure well and remained hemodynamically stable throughout. No complications were encountered and no significant blood loss. IMPRESSION: Status post ultrasound-guided biopsy of left subpectoral lymph node. Tissue specimen sent to pathology for complete histopathologic analysis. Signed, Dulcy Fanny. Dellia Nims, RPVI [facility] Electronically Signed   By: Corrie Mckusick D.O.   On: 10/05/2018 12:59   US Thoracentesis Asp Pleural Space W/img Guide  Result Date: 10/04/2018 INDICATION: Patient with remote history of breast cancer, lytic bony lesions, adenopathy, bilateral pleural effusions; request received for diagnostic and therapeutic left thoracentesis. EXAM: ULTRASOUND GUIDED DIAGNOSTIC AND THERAPEUTIC LEFT  THORACENTESIS MEDICATIONS: None COMPLICATIONS: None immediate. PROCEDURE: An ultrasound guided thoracentesis was thoroughly discussed with the patient/spouse/daughter and questions answered. The benefits, risks, alternatives and complications were also discussed. The patient understands and wishes to proceed with the procedure. Written  consent was obtained. Ultrasound was performed to localize and mark an adequate pocket of fluid in the left chest. The area was then prepped and draped in the normal sterile fashion. 1% Lidocaine was used for local anesthesia. Under ultrasound guidance a 6 Fr Safe-T-Centesis catheter was introduced. Thoracentesis was performed. The catheter was removed and a dressing applied. FINDINGS: A total of approximately 930 cc of slightly hazy, yellow fluid was removed. Samples were sent to the laboratory as requested by the clinical team. IMPRESSION: Successful ultrasound guided diagnostic and therapeutic left thoracentesis yielding 930 cc of pleural fluid. Read by: Rowe Robert, PA-C Electronically Signed   By: Markus Daft M.D.   On: 10/04/2018 13:08    Time Spent in minutes  25   Alauna Hayden M.D on 10/07/2018 at 2:57 PM  Between 7am to 7pm - Pager - [phone]  After 7pm go to [email or web address] - password [facility]  [facility] -  Office  [phone]

## 2018-10-08 ENCOUNTER — Telehealth: Payer: Self-pay | Admitting: *Deleted

## 2018-10-08 DIAGNOSIS — R112 Nausea with vomiting, unspecified: Secondary | ICD-10-CM

## 2018-10-08 DIAGNOSIS — C50919 Malignant neoplasm of unspecified site of unspecified female breast: Secondary | ICD-10-CM

## 2018-10-08 LAB — BASIC METABOLIC PANEL
ANION GAP: 4 — AB (ref 5–15)
BUN: 34 mg/dL — ABNORMAL HIGH (ref 8–23)
CALCIUM: 9.4 mg/dL (ref 8.9–10.3)
CO2: 29 mmol/L (ref 22–32)
Chloride: 107 mmol/L (ref 98–111)
Creatinine, Ser: 0.83 mg/dL (ref 0.44–1.00)
GFR calc Af Amer: >60 mL/min (ref >60)
GFR calc non Af Amer: >60 mL/min (ref >60)
Glucose, Bld: 147 mg/dL — ABNORMAL HIGH (ref 70–99)
POTASSIUM: 4.1 mmol/L (ref 3.5–5.1)
SODIUM: 140 mmol/L (ref 135–145)

## 2018-10-08 MED ORDER — METOCLOPRAMIDE HCL 5 MG/ML IJ SOLN
5.0000 mg | Freq: Four times a day (QID) | INTRAMUSCULAR | Status: DC | PRN
  Administered 2018-10-08: 5 mg via INTRAVENOUS
  Filled 2018-10-08: qty 2

## 2018-10-08 MED ORDER — METOCLOPRAMIDE HCL 5 MG PO TABS: 5.0000 mg | ORAL_TABLET | Freq: Four times a day (QID) | ORAL | Status: DC | PRN

## 2018-10-08 MED ORDER — MORPHINE SULFATE (CONCENTRATE) 10 MG/0.5ML PO SOLN
10.0000 mg | ORAL | Status: DC | PRN
  Administered 2018-10-08: 10 mg via ORAL
  Filled 2018-10-08: qty 0.5

## 2018-10-08 NOTE — Progress Notes (Addendum)
IP PROGRESS NOTE  Subjective:   [name] is more alert.  Her daughters are at the bedside.  She reports improvement in pain.  She was out of bed with physical therapy yesterday.  Objective: Vital signs in last 24 hours: Blood pressure (!) 160/60, pulse 96, temperature 98.2 F (36.8 C), temperature source Oral, resp. rate 16, height '5\' 3"'$  (1.6 m), weight 111 lb 1.6 oz (50.4 kg), SpO2 95 %.  Intake/Output from previous day: 11/14 0701 - 11/15 0700 In: 717 [P.O.:480; I.V.:237] Out: 1050 [Urine:1050]  Physical Exam: Lungs: Lungs clear bilaterally, no respiratory distress  Cardiovascular: Regular rate and rhythm Abdomen: Soft and nontender, no hepatosplenomegaly Vascular: No leg edema Neurologic: Alert, follows commands      Lab Results: Recent Labs    10/06/18 0545  WBC 14.1*  HGB 10.4*  HCT 33.6*  PLT 387    BMET Recent Labs    10/06/18 0545  NA 134*  K 4.2  CL 100  CO2 20*  GLUCOSE 117*  BUN 34*  CREATININE 1.05*  CALCIUM 10.1    No results found for: CEA1  Studies/Results: No results found.  Medications: I have reviewed the patient's current medications.  Assessment/Plan:  1.  Hypercalcemia-status post Zometa on 10/02/2018, improved 2.  Lytic bone lesions left subpectoral node, rectal and bladder thickening on CTs 10/02/2018 and 10/03/2018  Ultrasound biopsy left subpectoral lymph node 10/05/2018-histology consistent with metastatic breast cancer 3.  Nausea and vomiting 10/08/2018-etiology unclear, potentially related to morphine 4.  History of left breast DCIS, status post a left mastectomy in 2001 5.  Altered mental status secondary to #1 6.  History of Barrett's esophagus 7.  Osteoporosis 8.  Nephrectomy in 1955 9.  Pain secondary to lytic bone lesions 10. Left neck mass 11. Anemia 12.  Bilateral pleural effusions-status post a left thoracentesis 10/04/2018  ShellyFadness appears to have significant improvement in her performance status over the  past few days.  She is now eating and getting out of bed.  Her pain is under better control.  I discussed the pathology findings with [name] and her family.  She appears to have metastatic breast cancer.  We are waiting on the ER/PR and HER-2 stains.  [name] and her family are in agreement with skilled nursing facility placement to continue physical therapy.  If the tumor returns ER/PR positive there is a high chance of clinical improvement with antiestrogen therapy.  I recommend letrozole.  We reviewed potential toxicities associated with aromatase inhibitor therapy including the chance for arthralgias, hot flashes, and decreased bone density.  She agrees to proceed with letrozole if the hormone receptor status returns positive.  She had an episode of vomiting while I was in the examination room today.  The etiology of the vomiting is unclear.  This could potentially be related to MS Contin.  I will discontinue the MS Contin.  She can continue oxycodone for pain. She will be scheduled for an office visit at the Cancer center during the week of 10/18/2018.  We decided to hold on a [facility] hospice referral until she is seen at the Cancer center.  Recommendations: 1.  Continue prednisone 2.  Discontinue morphine, continue oxycodone as needed for pain, can add once daily OxyContin (10 mg) if she requires frequent oxycodone 3.  Continue physical therapy 4.  Begin letrozole, 2.5 mg daily, if hormone receptor positive breast cancer is confirmed  Please call Oncology as needed over the weekend.  I will see her 10/11/2018  if she is not discharged.  Outpatient follow-up will be scheduled at the Cancer center.  Addendum: The breast prognostic profile on the lymph node biopsy returned.  The tumor is ER negative, PR negative, and HER-2 negative.  She will not be a candidate for antiestrogen therapy.  I discussed the pathology with her daughter.  [name] will follow-up at the Cancer center as an  outpatient.   LOS: 6 days   Betsy Coder, MD   10/08/2018, 7:28 AM

## 2018-10-08 NOTE — Progress Notes (Signed)
CSW following patient for support and discharge needs. CSW received a phone call from [facility] has approved patient short term rehab stay. CSW gave information to [facility]. CSW made MD aware that patient now has authorization and a bed ready for dishcrage tomorrow.  Auth number: [id number] Approved for 3 days next review date would be 11/17 Approved for 575 minutes per week for therapy PT/OT: TM Speech: SG Non theraphy: NF Nursing : [place], MSW,  [place]

## 2018-10-08 NOTE — Progress Notes (Signed)
Clinical Social Worker following patient for support and discharge needs. Patient and family were given bed offers and family chose [facility]. CSW reached out to [facility] admission coordinator for facility and she stated she will be able to accept patient tomorrow (10/09/18). Authorization through [employer] has been started and CSW is awaiting auth. Family is to go to facility today at 1 pm to fill out paperwork   Rhea Pink, MSW,  [place]

## 2018-10-08 NOTE — Progress Notes (Signed)
PROGRESS NOTE                                                                                                                                                                                                             Patient Demographics:    [name], is a 84 y.o. female, DOB - 1929-03-29, KPT:[id number]  Admit date - 10/02/2018   Admitting Physician Shela Leff, MD  Outpatient Primary MD for the patient is Janie Morning, DO  LOS - 6  Outpatient Specialists: Dr. Learta Codding  Chief Complaint  Patient presents with  . Altered Mental Status       Brief Narrative   84 year old female with history of breast cancer, skin cancer, hypertension, hyperlipidemia presented with altered mental status as per her family.  Also complained of nausea but no vomiting. In the ED vitals were stable with mildly elevated WBC of 13 K, corrected calcium of 14.9 and chest x-ray showing mild left pleural effusion.  Head CT unremarkable while CT of the pelvis showed multiple lytic lesion in the sacrum and the pelvis (largest seen posteriorly in the right sacrum).  This was suspicious of metastatic disease versus possible multiple myeloma. Admitted for IV hydration and further work-up and biopsy of the left subpectoral lymph node..    Subjective:   Having back pain, nauseous with 3 episodes of vomiting since yesterday.  Having bowel movement.  Assessment  & Plan :    Principal Problem:   Hypercalcemia of malignancy Soft tissue biopsy suggestive of metastatic breast cancer.  Final report should be available today. Dr. Learta Codding discussed treatment options with patient and her family.  If tumor returns ER-PR positive he feels there is high chance of improvement with letrozole which should be started as outpatient.  Patient and family agree with the plan. Calcium level normalized. Added prednisone and MS Contin for pain.  Patient having nausea  and vomiting possibly due to MS Contin.  Switched to [employer] as needed.    Plan on discharge to SNF with palliative care follow-up. Dr. Learta Codding with see the patient in the office in 2 weeks.    Active Problems:   Acute metabolic encephalopathy Secondary to severe hypercalcemia.  Now resolved.  No signs of infection.  Head CT unremarkable.   Left pleural effusion Underwent thoracentesis.  Cytology from the pleural fluid showing lymphoid  cells.  Normocytic anemia Stable.  No need for transfusion.  Leukocytosis?  Mild proctocolitis on CT. No clinical signs of infection.  No antibiotics given.  Hyponatremia Mild.  Resolved with fluids.  Multiple thyroid nodules Check TSH/free T4.  Perineal area and buttocks skin tear Wound care consult appreciated.Gerhart's Butt cream applied three times daily (zinc oxide/hydrocortisone cream/lotrimin cream).  Left hydronephrosis Seen in CT.  Had poor urine output during hospital stay.  Foley placed and discussed with urology who recommend to continue Foley placement.  Having good urine output.  Does not want to do voiding trial at present and can be done at SNF if unsuccessful will need urology referral as outpatient.  Code Status : DNR  Family Communication  : Husband and daughter at bedside  Disposition Plan  : SNF with palliative care follow-up.  Barriers For Discharge : Improving symptoms, plan for discharge tomorrow  Consults  : Oncology, palliative care  Procedures  : CT abdomen, ultrasound-guided biopsy of left subpectoral lymph node and left pleural fluid  DVT Prophylaxis  :  Lovenox -  Lab Results  Component Value Date   PLT 387 10/06/2018    Antibiotics    Anti-infectives (From admission, onward)   None        Objective:   Vitals:   10/07/18 2108 10/08/18 0500 10/08/18 0506 10/08/18 1338  BP: (!) 143/59  (!) 160/60 (!) 129/48  Pulse: 89  96 90  Resp: '16  16 18  '$ Temp: 02.7 F (36.4 C)  98.2 F (36.8 C) 97.8 F  (36.6 C)  TempSrc:   Oral   SpO2: 94%  95% 94%  Weight:  50.4 kg    Height:        Wt Readings from Last 3 Encounters:  10/08/18 50.4 kg  12/05/15 61.2 kg  10/01/15 61.7 kg     Intake/Output Summary (Last 24 hours) at 10/08/2018 1444 Last data filed at 10/08/2018 7412 Gross per 24 hour  Intake 716.96 ml  Output 1050 ml  Net -333.04 ml   Physical exam Fatigued, not in acute distress HEENT: Pallor present, moist mucosa Chest: Clear bilaterally CVS: Normal S1 and S2, no murmurs GI: Soft, nondistended, nontender, bowel sounds present, Foley draining clear urine Muscular skeletal: Warm, no edema     Data Review:    CBC Recent Labs  Lab 10/02/18 1529 10/03/18 0542 10/04/18 0549 10/05/18 0624 10/06/18 0545  WBC 12.9* 11.4* 14.0* 13.0* 14.1*  HGB 10.5* 10.1* 9.5* 9.8* 10.4*  HCT 34.0* 32.0* 31.5* 32.4* 33.6*  PLT 323 320 300 320 387  MCV 84.8 84.4 85.1 85.3 84.6  MCH 26.2 26.6 25.7* 25.8* 26.2  MCHC 30.9 31.6 30.2 30.2 31.0  RDW 16.2* 16.4* 16.0* 16.2* 16.1*  LYMPHSABS 1.3  --  0.8 0.8 0.7  MONOABS 1.0  --  0.9 0.8 0.4  EOSABS 0.2  --  0.1 0.2 0.0  BASOSABS 0.1  --  0.1 0.1 0.1    Chemistries  Recent Labs  Lab 10/02/18 1529 10/03/18 0542 10/03/18 1606 10/04/18 0549 10/05/18 0624 10/06/18 0545  NA 132* 132* 132* 132* 132* 134*  K 3.6 3.5 3.5 3.8 3.6 4.2  CL 91* 96* 98 101 100 100  CO2 '31 27 26 22 22 '$ 20*  GLUCOSE 115* 108* 96 85 68* 117*  BUN 27* 25* 27* 26* 30* 34*  CREATININE 0.97 0.97 1.04* 1.13* 1.12* 1.05*  CALCIUM 14.3* 13.3* 12.6* 11.5* 10.6* 10.1  MG  --   --   --  1.7 1.7 2.2  AST 69* 64*  --  62* 63* 69*  ALT 24 24  --  28 26 33  ALKPHOS 124 104  --  93 93 109  BILITOT 0.8 0.6  --  0.5 0.5 0.9   ------------------------------------------------------------------------------------------------------------------ No results for input(s): CHOL, HDL, LDLCALC, TRIG, CHOLHDL, LDLDIRECT in the last 72 hours.  No results found for:  HGBA1C ------------------------------------------------------------------------------------------------------------------ Recent Labs    10/06/18 1647  TSH 1.999   ------------------------------------------------------------------------------------------------------------------ No results for input(s): VITAMINB12, FOLATE, FERRITIN, TIBC, IRON, RETICCTPCT in the last 72 hours.  Coagulation profile Recent Labs  Lab 10/05/18 0624  INR 1.10    No results for input(s): DDIMER in the last 72 hours.  Cardiac Enzymes No results for input(s): CKMB, TROPONINI, MYOGLOBIN in the last 168 hours.  Invalid input(s): CK ------------------------------------------------------------------------------------------------------------------    Component Value Date/Time   BNP 243.6 (H) 10/04/2018 0549    Inpatient Medications  Scheduled Meds: . aspirin EC  81 mg Oral Daily  . enoxaparin (LOVENOX) injection  30 mg Subcutaneous QHS  . Gerhardt's butt cream   Topical TID  . polyethylene glycol  17 g Oral BID  . predniSONE  10 mg Oral BID  . senna-docusate  2 tablet Oral BID   Continuous Infusions: . sodium chloride 1,000 mL (10/07/18 0617)   PRN Meds:.sodium chloride, acetaminophen **OR** acetaminophen, alum & mag hydroxide-simeth, hydrALAZINE, ipratropium-albuterol, metoCLOPramide (REGLAN) injection **OR** metoCLOPramide, ondansetron (ZOFRAN) IV, oxyCODONE, polyvinyl alcohol  Micro Results Recent Results (from the past 240 hour(s))  Urine Culture     Status: None   Collection Time: 10/02/18  7:20 PM  Result Value Ref Range Status   Specimen Description   Final    URINE, CLEAN CATCH Performed at [facility], [place] [address] [place], [place] [address]    Special Requests   Final    NONE Performed at [facility], [place] [address], [place], [place] [address]    Culture   Final    NO GROWTH Performed at [facility], [place] [address]  [place], [place] [address]    Report Status 10/04/2018 FINAL  Final  Acid Fast Smear (AFB)     Status: None   Collection Time: 10/04/18  1:01 PM  Result Value Ref Range Status   AFB Specimen Processing Concentration  Final   Acid Fast Smear Negative  Final    Comment: (NOTE) Performed At: [facility] [place], [place] [id number] Rush Farmer MD FG:[id number]    Source (AFB) PLEURAL  Final    Comment: LEFT Performed at [facility] [place] [address], [place], [place] [address]   Culture, body fluid-bottle     Status: None (Preliminary result)   Collection Time: 10/04/18  1:01 PM  Result Value Ref Range Status   Specimen Description PLEURAL  Final   Special Requests NONE  Final   Culture   Final    NO GROWTH 4 DAYS Performed at [place] [address], [place], [place] [address]    Report Status PENDING  Incomplete  Gram stain     Status: None   Collection Time: 10/04/18  1:01 PM  Result Value Ref Range Status   Specimen Description PLEURAL  Final   Special Requests NONE  Final   Gram Stain   Final    WBC PRESENT,BOTH PMN AND MONONUCLEAR NO ORGANISMS SEEN CYTOSPIN SMEAR Performed at [facility], [address] [place],  [place] [address]    Report Status 10/04/2018 FINAL  Final    Radiology Reports Dg Chest 1 View  Result Date: 10/04/2018 CLINICAL DATA:  Status post left thoracentesis. EXAM: CHEST  1 VIEW COMPARISON:  Radiograph of October 04, 2018. FINDINGS: Stable cardiomediastinal silhouette. No pneumothorax is noted. Left pleural effusion noted on prior exam is significantly decreased. Mild right basilar atelectasis and effusion is noted. Residual left basilar subsegmental atelectasis is noted. Bony thorax is unremarkable. IMPRESSION: Significantly decreased left pleural effusion status post thoracentesis. No pneumothorax is noted. Electronically Signed   By: Marijo Conception, M.D.   On: 10/04/2018 12:40   Dg Chest  2 View  Result Date: 10/02/2018 CLINICAL DATA:  Chest pain. EXAM: CHEST - 2 VIEW COMPARISON:  Radiographs of September 07, 2018. FINDINGS: Stable cardiomediastinal silhouette. No pneumothorax is noted. Mild left pleural effusion is noted with probable underlying atelectasis or infiltrate. Minimal right pleural effusion is noted. Bony thorax is unremarkable. IMPRESSION: Mild left pleural effusion is noted with probable underlying atelectasis or infiltrate. Minimal right pleural effusion. Electronically Signed   By: Marijo Conception, M.D.   On: 10/02/2018 16:22   Ct Head Wo Contrast  Result Date: 10/02/2018 CLINICAL DATA:  Patient with chest pain.  Nausea. EXAM: CT HEAD WITHOUT CONTRAST TECHNIQUE: Contiguous axial images were obtained from the base of the skull through the vertex without intravenous contrast. COMPARISON:  None. FINDINGS: Brain: Ventricles and sulci are appropriate for patient's age. No evidence for acute cortically based infarct, intracranial hemorrhage, mass lesion or mass-effect. Vascular: Unremarkable Skull: Intact. Sinuses/Orbits: Paranasal sinuses well aerated. Mastoid air cells unremarkable. Orbits unremarkable. Other: None. IMPRESSION: No acute intracranial process. Electronically Signed   By: Lovey Newcomer M.D.   On: 10/02/2018 16:59   Ct Soft Tissue Neck Wo Contrast  Result Date: 10/03/2018 CLINICAL DATA:  Metastatic cancer. EXAM: CT NECK WITHOUT CONTRAST TECHNIQUE: Multidetector CT imaging of the neck was performed following the standard protocol without intravenous contrast. COMPARISON:  None. FINDINGS: Pharynx and larynx: No mucosal or submucosal lesion. Salivary glands: Parotid and submandibular glands are normal. Thyroid: Multiple thyroid nodules. This could be benign goiter. Consider thyroid ultrasound for more accurate evaluation. Lymph nodes: No enlarged or low-density nodes in the neck region. There slightly prominent nodes in the supraclavicular fat on both sides. These are  nonspecific. They could represent reactive nodes or malignancy. Vascular: Ordinary atherosclerotic calcification of the carotid bifurcation regions. Limited intracranial: Negative Visualized orbits: Normal Mastoids and visualized paranasal sinuses: Clear Skeleton: Lytic foci scattered throughout the cervical and thoracic spine consistent with osseous metastatic disease. No evidence of pathologic fracture in the cervical region. Possible pathologic fracture at T4. No discernible tumor involvement of the canal, but CT is not sensitive. Upper chest: See results of chest CT Other: None IMPRESSION: Lytic metastatic lesions affecting the cervical and upper thoracic spine. No evidence of pathologic fracture in the cervical region. Probable pathologic fracture of T4. See results of chest CT. Slightly prominent supraclavicular lymph nodes on each side. No abnormal nodes higher in the neck. Multiple thyroid nodules. Most likely diagnosis is benign goiter, but thyroid ultrasound could be performed for more accurate evaluation if primary carcinoma is not identified elsewhere. In the absence of regional lymph nodes, it would be unusual for this to be thyroid carcinoma. Electronically Signed   By: Nelson Chimes M.D.   On: 10/03/2018 10:55   Ct Chest Wo Contrast  Result Date: 10/03/2018 CLINICAL DATA:  Patient with altered mental status.  Lytic lesions within the pelvis. Effusion. EXAM: CT CHEST WITHOUT CONTRAST TECHNIQUE: Multidetector CT imaging of the chest was performed following the standard protocol without IV contrast. COMPARISON:  CT pelvis 10/02/2018; chest radiograph 10/02/2018 FINDINGS: Cardiovascular: Heart is enlarged. Trace pericardial effusion. Thoracic aortic vascular calcification. Mediastinum/Nodes: Prominent 9 mm left axillary lymph node (image 46; series 2). 2.6 x 3.5 cm left subpectoral lymph node. 1.2 cm right axillary lymph node (image 41; series 2). 1.0 cm node superior mediastinum (image 40; series 2).  Normal appearance of the esophagus. Heterogeneous thyroid. Lungs/Pleura: Central airways are patent. Moderate left and small right pleural effusions. Subpleural consolidation within the left and right lower lobes. No pneumothorax. Upper Abdomen: Small volume perihepatic ascites. Musculoskeletal: Posterior left ninth, tenth and eleventh healing rib fractures. Healing posterior right sixth rib fracture. Multiple healing lateral right rib fractures involving the third and seventh ribs. Mixed lucent and sclerotic lesions involving the T1, T2, T4 and T12 vertebral bodies. There is a mild pathologic fracture of the T4 vertebral body with associated height loss. Prior left mastectomy. Small lucent lesions involving the ribs bilaterally. IMPRESSION: 1. Large left subpectoral lymph node. Additionally there is prominent left axillary and enlarged right axillary adenopathy. Findings are concerning for metastatic disease. 2. Lytic lesions involving the thoracic spine and ribs concerning for osseous metastatic disease. There is mild height loss of the T4 vertebral body most compatible with pathologic fracture. 3. Moderate left and small right layering pleural effusions. There is underlying consolidation within the adjacent pulmonary parenchyma which may represent atelectasis or infection. 4. Aortic Atherosclerosis (ICD10-I70.0). Electronically Signed   By: Lovey Newcomer M.D.   On: 10/03/2018 12:54   Ct Pelvis Wo Contrast  Result Date: 10/02/2018 CLINICAL DATA:  Left sacroiliac joint pain. EXAM: CT PELVIS WITHOUT CONTRAST TECHNIQUE: Multidetector CT imaging of the pelvis was performed following the standard protocol without intravenous contrast. COMPARISON:  CT scan of February 17, 2014. FINDINGS: Urinary Tract: Wall thickening is seen involving superior portion of urinary bladder. Bowel: Sigmoid diverticulosis is noted. There is no evidence of bowel obstruction. Wall thickening of distal sigmoid colon and rectum is noted with  surrounding inflammation consistent with proctocolitis or possibly neoplasm. Vascular/Lymphatic: Atherosclerosis of abdominal aorta is noted. Left inguinal lymph node measuring 1.1 cm is noted. 9 mm left common iliac lymph node is noted. Reproductive: No mass or other significant abnormality. Status post hysterectomy. Other:  No hernia or abnormal fluid collection is noted. Musculoskeletal: Multiple lytic lesions are seen in the pelvis and sacrum, consistent with metastatic disease. Largest lesion is seen posteriorly in right sacrum. IMPRESSION: Multiple lytic lesions are noted in the pelvis and sacrum, with the largest seen posteriorly in right sacrum. These are consistent with metastatic disease or possibly multiple myeloma. Wall thickening of distal sigmoid colon and rectum is noted with surrounding inflammation most consistent with proctocolitis, although neoplasm cannot be excluded. Sigmoidoscopy is recommended for further evaluation. Left inguinal and common iliac adenopathy is noted which may be inflammatory in etiology, but malignancy cannot be excluded. Wall thickening is seen involving the superior portion of urinary bladder; neoplasm cannot be excluded and cystoscopy is recommended. Electronically Signed   By: Marijo Conception, M.D.   On: 10/02/2018 17:06   US Renal  Result Date: 10/05/2018 CLINICAL DATA:  Hydronephrosis. EXAM: RENAL / URINARY TRACT ULTRASOUND COMPLETE COMPARISON:  Ultrasound 10/04/2018. FINDINGS: Right Kidney: Right nephrectomy. Left Kidney: Renal measurements: 12.1 x 6.0 x 6.7 cm = volume: 254.2 cc mL. Echogenicity within normal  limits. Mild hydronephrosis. 2.2 cm simple cyst. Bladder: Foley catheter noted bladder.  Bladder is nondistended. Bilateral pleural effusions noted.  Ascites noted. IMPRESSION: 1. Mild to moderate left hydronephrosis. Similar findings noted on prior exam. 2.  Prior right nephrectomy. Electronically Signed   By: Marcello Moores  Register   On: 10/05/2018 13:40   US  Renal  Result Date: 10/03/2018 CLINICAL DATA:  Decreased urine output. History of RIGHT nephrectomy. EXAM: RENAL / URINARY TRACT ULTRASOUND COMPLETE COMPARISON:  CT pelvis dated 10/02/2018. FINDINGS: Right Kidney: Surgically removed. Left Kidney: Renal measurements: 11.9 x 6.2 x 6.2 cm = volume: 240 mL. Mild to moderate hydronephrosis. LEFT renal cyst measures 2.4 cm. Bladder: Appears normal for degree of bladder distention. LEFT ureteral jet is visualized. IMPRESSION: 1. LEFT renal hydronephrosis, mild to moderate in degree. Suspect some degree of associated obstruction at the level of the bladder wall thickening as described on recent pelvis CT of 10/02/2018. However, a LEFT ureteral jet is visualized at the level of the bladder indicating patency of the LEFT UVJ. 2. Status post RIGHT nephrectomy. Electronically Signed   By: Franki Cabot M.D.   On: 10/03/2018 18:54   US Venous Img Lower Bilateral  Result Date: 09/27/2018 CLINICAL DATA:  Bilateral lower extremity edema and pain. EXAM: BILATERAL LOWER EXTREMITY VENOUS DOPPLER ULTRASOUND TECHNIQUE: Gray-scale sonography with graded compression, as well as color Doppler and duplex ultrasound were performed to evaluate the lower extremity deep venous systems from the level of the common femoral vein and including the common femoral, femoral, profunda femoral, popliteal and calf veins including the posterior tibial, peroneal and gastrocnemius veins when visible. The superficial great saphenous vein was also interrogated. Spectral Doppler was utilized to evaluate flow at rest and with distal augmentation maneuvers in the common femoral, femoral and popliteal veins. COMPARISON:  None. FINDINGS: RIGHT LOWER EXTREMITY Common Femoral Vein: No evidence of thrombus. Normal compressibility, respiratory phasicity and response to augmentation. Saphenofemoral Junction: No evidence of thrombus. Normal compressibility and flow on color Doppler imaging. Profunda Femoral Vein:  No evidence of thrombus. Normal compressibility and flow on color Doppler imaging. Femoral Vein: No evidence of thrombus. Normal compressibility, respiratory phasicity and response to augmentation. Popliteal Vein: No evidence of thrombus. Normal compressibility, respiratory phasicity and response to augmentation. Calf Veins: No evidence of thrombus. Normal compressibility and flow on color Doppler imaging. Superficial Great Saphenous Vein: No evidence of thrombus. Normal compressibility. Venous Reflux:  None. Other Findings: No evidence of superficial thrombophlebitis or abnormal fluid collection. LEFT LOWER EXTREMITY Common Femoral Vein: No evidence of thrombus. Normal compressibility, respiratory phasicity and response to augmentation. Saphenofemoral Junction: No evidence of thrombus. Normal compressibility and flow on color Doppler imaging. Profunda Femoral Vein: No evidence of thrombus. Normal compressibility and flow on color Doppler imaging. Femoral Vein: No evidence of thrombus. Normal compressibility, respiratory phasicity and response to augmentation. Popliteal Vein: No evidence of thrombus. Normal compressibility, respiratory phasicity and response to augmentation. Calf Veins: No evidence of thrombus. Normal compressibility and flow on color Doppler imaging. Superficial Great Saphenous Vein: No evidence of thrombus. Normal compressibility. Venous Reflux:  None. Other Findings: No evidence of superficial thrombophlebitis or abnormal fluid collection. IMPRESSION: No evidence of bilateral lower extremity deep venous thrombosis. Electronically Signed   By: Aletta Edouard M.D.   On: 09/27/2018 16:42   Dg Chest Port 1 View  Result Date: 10/06/2018 CLINICAL DATA:  Shortness of breath.  Follow-up exam. EXAM: PORTABLE CHEST 1 VIEW COMPARISON:  10/05/2018 FINDINGS: There is persistent left lung  base opacity consistent with combination pleural fluid with atelectasis pneumonia. Mild hazy opacity at the right  lung base is consistent a small effusion with atelectasis. Are prominent interstitial markings bilaterally without overt pulmonary edema. No new lung abnormalities. No pneumothorax IMPRESSION: No significant change from the prior day's study allowing for differences in patient positioning. Persistent left basilar opacity consistent combination of pleural fluid and atelectasis or pneumonia. Minor right pleural effusion with atelectasis Electronically Signed   By: Lajean Manes M.D.   On: 10/06/2018 08:38   Dg Chest Port 1 View  Result Date: 10/05/2018 CLINICAL DATA:  Shortness of breath EXAM: PORTABLE CHEST 1 VIEW COMPARISON:  10/04/2018 FINDINGS: Cardiac shadow is stable. The lungs are well aerated bilaterally. Small bilateral pleural effusions are again seen and stable. Left basilar atelectatic changes are noted slightly greater than that seen on the prior exam. No pneumothorax is noted. IMPRESSION: Slight increase in left basilar atelectasis. Persistent small pleural effusions. Electronically Signed   By: Inez Catalina M.D.   On: 10/05/2018 08:26   Dg Chest Port 1 View  Result Date: 10/04/2018 CLINICAL DATA:  84 year old female. Pleural effusion. Subsequent encounter. EXAM: PORTABLE CHEST 1 VIEW COMPARISON:  10/03/2018 chest CT and 10/02/2018 chest x-ray. FINDINGS: Rotation to the right. When compared to prior chest x-ray, progressive pulmonary vascular congestion/pulmonary edema. Increase in size of left-sided pleural effusion. Left base consolidation may represent atelectasis or infiltrate. Cardiomegaly. IMPRESSION: 1. Progressive pulmonary vascular congestion/pulmonary edema. 2. Increase in size of left-sided pleural effusion. Left base consolidation may represent atelectasis or infiltrate. 3. Cardiomegaly. Electronically Signed   By: Genia Del M.D.   On: 10/04/2018 07:03   [place] Core Biopsy (lymph Nodes)  Result Date: 10/05/2018 INDICATION: 84 year old female with a history of left breast  carcinoma. Likely metastatic/recurrent disease to the lymph nodes. She presents for biopsy EXAM: ULTRASOUND GUIDED BIOPSY SUBPECTORAL LYMPH NODE MEDICATIONS: None. ANESTHESIA/SEDATION: Moderate (conscious) sedation was employed during this procedure. A total of Versed 0.5 mg and Fentanyl 25 mcg was administered intravenously. Moderate Sedation Time: 10 minutes. The patient's level of consciousness and vital signs were monitored continuously by radiology nursing throughout the procedure under my direct supervision. FLUOROSCOPY TIME:  None COMPLICATIONS: None PROCEDURE: Informed written consent was obtained from the patient after a thorough discussion of the procedural risks, benefits and alternatives. All questions were addressed. Maximal Sterile Barrier Technique was utilized including caps, mask, sterile gowns, sterile gloves, sterile drape, hand hygiene and skin antiseptic. A timeout was performed prior to the initiation of the procedure. Patient positioned supine position on the ultrasound table. Images of the left chest were performed with images stored sent to PACs. The patient is prepped and draped in the usual sterile fashion. 1% lidocaine was used for local anesthesia. Using ultrasound guidance, guide needle was advanced into the subpectoral pathologic lymph node. Multiple 18 gauge core biopsy were performed. Needles removed. Final image was stored. Patient tolerated the procedure well and remained hemodynamically stable throughout. No complications were encountered and no significant blood loss. IMPRESSION: Status post ultrasound-guided biopsy of left subpectoral lymph node. Tissue specimen sent to pathology for complete histopathologic analysis. Signed, Dulcy Fanny. Dellia Nims, RPVI [facility] Radiology Electronically Signed   By: Corrie Mckusick D.O.   On: 10/05/2018 12:59   US Thoracentesis Asp Pleural Space W/img Guide  Result Date: 10/04/2018 INDICATION:  Patient with remote history of breast cancer, lytic bony lesions, adenopathy, bilateral pleural effusions; request received for diagnostic and therapeutic left  thoracentesis. EXAM: ULTRASOUND GUIDED DIAGNOSTIC AND THERAPEUTIC LEFT THORACENTESIS MEDICATIONS: None COMPLICATIONS: None immediate. PROCEDURE: An ultrasound guided thoracentesis was thoroughly discussed with the patient/spouse/daughter and questions answered. The benefits, risks, alternatives and complications were also discussed. The patient understands and wishes to proceed with the procedure. Written consent was obtained. Ultrasound was performed to localize and mark an adequate pocket of fluid in the left chest. The area was then prepped and draped in the normal sterile fashion. 1% Lidocaine was used for local anesthesia. Under ultrasound guidance a 6 Fr Safe-T-Centesis catheter was introduced. Thoracentesis was performed. The catheter was removed and a dressing applied. FINDINGS: A total of approximately 930 cc of slightly hazy, yellow fluid was removed. Samples were sent to the laboratory as requested by the clinical team. IMPRESSION: Successful ultrasound guided diagnostic and therapeutic left thoracentesis yielding 930 cc of pleural fluid. Read by: Rowe Robert, PA-C Electronically Signed   By: Markus Daft M.D.   On: 10/04/2018 13:08    Time Spent in minutes  25   Valeda Corzine M.D on 10/08/2018 at 2:44 PM  Between 7am to 7pm - Pager - [phone]  After 7pm go to [email or web address] - password [facility]  [facility] -  Office  [phone]

## 2018-10-08 NOTE — Telephone Encounter (Signed)
Called hospital room with appointment and spoke with sister. Provided lab/OV on 10/05/18 at 1015/1045 with NP.

## 2018-10-08 NOTE — Progress Notes (Signed)
Physical Therapy Treatment Patient Details Name: [name] MRN: [id number] DOB: 06-Jan-1929 Today's Date: 10/08/2018    History of Present Illness 84 yo female admitted for confusion, back pain, and bilateral pleural effusions on 11/9. CT of pelvis reveals multiple lytic lesions to pelvis and sacrum, possible metastatic disease/multiple myeloma. L thoracocentesis 11/11 with 1L fluid removal. PMH includes basal cell carcinoma, breast cancer, barrett esophagus, HTN, IBS, OA, OP, pre-DM.     PT Comments    Pt with improved ambulation distance today, limited by fatigue. Pt also with short step length bilaterally, pt encouraged to take larger steps when ambulating. PT continuing to recommend SNF placement at this time given pt's deficits. Will continue to follow acutely.    Follow Up Recommendations  Supervision for mobility/OOB;SNF     Equipment Recommendations  None recommended by PT    Recommendations for Other Services       Precautions / Restrictions Precautions Precautions: Fall Restrictions Weight Bearing Restrictions: No    Mobility  Bed Mobility Overal bed mobility: Needs Assistance Bed Mobility: Rolling;Sidelying to Sit Rolling: Mod assist;+2 for physical assistance;+2 for safety/equipment Sidelying to sit: Mod assist;+2 for physical assistance;+2 for safety/equipment;HOB elevated       General bed mobility comments: Mod assist for bed mobility for LE and trunk management. Pt with posterior trunk lean initially, tactile cuing to sit erect at EOB. PT scooted pt forward with use of bed pad due to pt's difficulty with scooting.   Transfers Overall transfer level: Needs assistance Equipment used: Rolling walker (2 wheeled) Transfers: Sit to/from Stand Sit to Stand: Mod assist;From elevated surface         General transfer comment: Mod assist for power up, steadying upon standing. Pt with hip flexion and forward flexed trunk, pt reporting "I feel like I am  leaning backwards" to PT, PT corrected pt posture with tactile cuing to posterior pelvis and sternum. Pt able to maintain corrections when tactile cuing removed.   Ambulation/Gait Ambulation/Gait assistance: Min assist;+2 safety/equipment(chair follow) Gait Distance (Feet): 45 Feet Assistive device: Rolling walker (2 wheeled) Gait Pattern/deviations: Step-through pattern;Decreased stride length;Shuffle;Trunk flexed Gait velocity: decr    General Gait Details: Min assist for steadying pt and directing RW. Pt provided verbal cuing for directing RW, stepping into RW, taking larger steps.    Stairs             Wheelchair Mobility    Modified Rankin (Stroke Patients Only)       Balance Overall balance assessment: Needs assistance Sitting-balance support: Feet supported;Bilateral upper extremity supported Sitting balance-Leahy Scale: Fair   Postural control: Posterior lean Standing balance support: Bilateral upper extremity supported;During functional activity Standing balance-Leahy Scale: Poor Standing balance comment: relies on PT and RW for steadying.                             Cognition Arousal/Alertness: Awake/alert Behavior During Therapy: WFL for tasks assessed/performed Overall Cognitive Status: Within Functional Limits for tasks assessed                                        Exercises      General Comments        Pertinent Vitals/Pain Pain Assessment: Faces Faces Pain Scale: Hurts a little bit Pain Location: back, hips, neck  Pain Descriptors / Indicators: Aching;Sore Pain Intervention(s): Limited activity  within patient's tolerance;Repositioned;Monitored during session    Home Living                      Prior Function            PT Goals (current goals can now be found in the care plan section) Acute Rehab PT Goals Patient Stated Goal: none stated  PT Goal Formulation: With patient Time For Goal  Achievement: 10/19/18 Potential to Achieve Goals: Good Progress towards PT goals: Progressing toward goals    Frequency    Min 2X/week      PT Plan Current plan remains appropriate    Co-evaluation              AM-PAC PT "6 Clicks" Daily Activity  Outcome Measure  Difficulty turning over in bed (including adjusting bedclothes, sheets and blankets)?: Unable Difficulty moving from lying on back to sitting on the side of the bed? : Unable Difficulty sitting down on and standing up from a chair with arms (e.g., wheelchair, bedside commode, etc,.)?: Unable Help needed moving to and from a bed to chair (including a wheelchair)?: A Little Help needed walking in hospital room?: A Little Help needed climbing 3-5 steps with a railing? : Total 6 Click Score: 10    End of Session Equipment Utilized During Treatment: Gait belt Activity Tolerance: Patient limited by fatigue;Patient tolerated treatment well Patient left: with call bell/phone within reach;with family/visitor present;in chair;with chair alarm set Nurse Communication: Mobility status PT Visit Diagnosis: Muscle weakness (generalized) (M62.81);Unsteadiness on feet (R26.81) Pain - Right/Left: (both)     Time: 3016-0109 PT Time Calculation (min) (ACUTE ONLY): 19 min  Charges:  $Gait Training: 8-22 mins                     Julien Girt, PT Acute Rehabilitation Services Pager [phone]  Office [phone]    Ford Peddie D Jeanna Giuffre 10/08/2018, 1:30 PM

## 2018-10-09 DIAGNOSIS — N1330 Unspecified hydronephrosis: Secondary | ICD-10-CM | POA: Diagnosis present

## 2018-10-09 DIAGNOSIS — C50919 Malignant neoplasm of unspecified site of unspecified female breast: Secondary | ICD-10-CM | POA: Diagnosis present

## 2018-10-09 DIAGNOSIS — K5903 Drug induced constipation: Secondary | ICD-10-CM

## 2018-10-09 DIAGNOSIS — K5900 Constipation, unspecified: Secondary | ICD-10-CM | POA: Diagnosis present

## 2018-10-09 DIAGNOSIS — J90 Pleural effusion, not elsewhere classified: Secondary | ICD-10-CM | POA: Diagnosis present

## 2018-10-09 DIAGNOSIS — I10 Essential (primary) hypertension: Secondary | ICD-10-CM | POA: Diagnosis present

## 2018-10-09 LAB — CULTURE, BODY FLUID-BOTTLE

## 2018-10-09 LAB — CULTURE, BODY FLUID W GRAM STAIN -BOTTLE: Culture: NO GROWTH

## 2018-10-09 MED ORDER — ACETAMINOPHEN 325 MG PO TABS: 650.0000 mg | ORAL_TABLET | Freq: Four times a day (QID) | ORAL | 0 refills | Status: DC | PRN

## 2018-10-09 MED ORDER — POLYVINYL ALCOHOL 1.4 % OP SOLN: 1.0000 [drp] | OPHTHALMIC | 0 refills | Status: AC | PRN

## 2018-10-09 MED ORDER — OXYCODONE HCL 5 MG PO TABS: 10.0000 mg | ORAL_TABLET | ORAL | 0 refills | Status: DC | PRN

## 2018-10-09 MED ORDER — ALUM & MAG HYDROXIDE-SIMETH 200-200-20 MG/5ML PO SUSP: 30.0000 mL | ORAL | 0 refills | Status: AC | PRN

## 2018-10-09 MED ORDER — GERHARDT'S BUTT CREAM: 1.0000 "application " | TOPICAL_CREAM | Freq: Three times a day (TID) | CUTANEOUS | 0 refills | Status: DC

## 2018-10-09 MED ORDER — METOCLOPRAMIDE HCL 5 MG PO TABS: 5.0000 mg | ORAL_TABLET | Freq: Four times a day (QID) | ORAL | 0 refills | Status: DC | PRN

## 2018-10-09 MED ORDER — BISACODYL 10 MG RE SUPP
10.0000 mg | Freq: Once | RECTAL | Status: AC
  Administered 2018-10-09: 10 mg via RECTAL
  Filled 2018-10-09: qty 1

## 2018-10-09 MED ORDER — SENNOSIDES-DOCUSATE SODIUM 8.6-50 MG PO TABS: 2.0000 | ORAL_TABLET | Freq: Two times a day (BID) | ORAL | 0 refills | Status: AC

## 2018-10-09 MED ORDER — PREDNISONE 10 MG PO TABS: 10.0000 mg | ORAL_TABLET | Freq: Two times a day (BID) | ORAL | 0 refills | Status: AC

## 2018-10-09 MED ORDER — POLYETHYLENE GLYCOL 3350 17 G PO PACK: 17.0000 g | PACK | Freq: Two times a day (BID) | ORAL | 0 refills | Status: AC

## 2018-10-09 NOTE — Progress Notes (Signed)
Per [name], daughter, pt has already received flu vaccine.

## 2018-10-09 NOTE — Progress Notes (Signed)
Clinical Social Worker facilitated patient discharge including contacting patient family and facility to confirm patient discharge plans.  Clinical information faxed to facility and family agreeable with plan.  CSW arranged ambulance transport via [facility] to [place]  .  RN to call [phone] (pt will go in rm# [id number]) for report prior to discharge.  Clinical Social Worker will sign off for now as social work intervention is no longer needed. Please consult [place] again if new need arises.  Rhea Pink, MSW, [place]

## 2018-10-09 NOTE — Discharge Summary (Addendum)
Physician Discharge Summary  [name] QIO:[id number] DOB: Apr 16, 1929 DOA: 10/02/2018  PCP: Janie Morning, DO  Admit date: 10/02/2018 Discharge date: 10/09/2018  Admitted From: Home Disposition: Skilled nursing facility  Recommendations for Outpatient Follow-up:  #1 follow-up with MD at SNF in 1 week.  Please consult palliative care at the facility. #2  Patient will follow up with Dr. Benay Spice (oncology) in 2 weeks.  #3 follow-up with urology Dr. Diona Fanti in 2 weeks.  Patient is being discharged on Foley catheter.    Home Health: None Equipment/Devices: Per therapy at the facility  Discharge Condition: Fair CODE STATUS: DO NOT RESUSCITATE Diet recommendation: Regular    Discharge Diagnoses:  Principal Problem:   Hypercalcemia of malignancy   Active Problems:   Acute metabolic encephalopathy   Anemia   Leukocytosis   Hyponatremia   Abnormal LFTs   Essential hypertension   Constipation, unspecified   Metastatic breast cancer (HCC)   Pleural effusion on left   Hydronephrosis of left kidney  Brief narrative/HPI 84 year old female with history of breast cancer, skin cancer, hypertension, hyperlipidemia presented with altered mental status as per her family.  Also complained of nausea but no vomiting. In the ED vitals were stable with mildly elevated WBC of 13 K, corrected calcium of 14.9 and chest x-ray showing mild left pleural effusion.  Head CT unremarkable while CT of the pelvis showed multiple lytic lesion in the sacrum and the pelvis (largest seen posteriorly in the right sacrum).  This was suspicious of metastatic disease versus possible multiple myeloma. Admitted for IV hydration and further work-up and biopsy of the left subpectoral lymph node.Marland Kitchen  Hospital course   Principal Problem:   Hypercalcemia of malignancy Soft tissue biopsy suggestive of metastatic breast cancer.   Both ER and PR negative so does not need antiestrogen therapy. Dr. Learta Codding discussed  treatment options with patient and her family and will see her in the office in 2 weeks. Calcium level normalized. Added prednisone and OxyIR for pain which she is tolerating well.    Bowel regimen increased.  Plan on discharge to SNF with palliative care follow-up.    Active Problems:   Acute metabolic encephalopathy Secondary to severe hypercalcemia.  Now resolved.  No signs of infection.  Head CT unremarkable.   Left pleural effusion Underwent thoracentesis.  Cytology from the pleural fluid showing lymphoid cells.  Constipation Secondary to opiate use.  Switched both MiraLAX and senna-Colace to schedule twice daily.  Ordered a dose of Dulcolax suppository prior to discharge.  Normocytic anemia Nutritional deficient vs secondary to malignancy. Stable.  No need for transfusion.   Leukocytosis?  Mild proctocolitis on CT. No clinical signs of infection.    Resolved.  No antibiotics given.  Hyponatremia Mild.  Resolved with fluids.  Multiple thyroid nodules TSH and free T4 normal.  Perineal area and buttocks skin tear Wound care consult appreciated.Gerhart's Butt cream applied three times daily (zinc oxide/hydrocortisone cream/lotrimin cream).  Left hydronephrosis Seen in CT.  Had poor urine output during hospital stay.  Foley placed and discussed with urology who recommend to continue Foley placement.  Having good urine output.  Does not want to do voiding trial at present.  Patient will be discharged with Foley and follow-up with urology in 2 weeks.  Voiding trial can be performed at the SNF in 1-2 weeks.   Family Communication  : Husband and daughter at bedside  Disposition Plan  : SNF with palliative care follow-up.    Consults  : Oncology,  palliative care  Procedures  : CT abdomen, ultrasound-guided biopsy of left subpectoral lymph node and left pleural fluid   Discharge Instructions   Allergies as of 10/09/2018   No Known Allergies      Medication List    STOP taking these medications   hydrochlorothiazide 12.5 MG tablet Commonly known as:  HYDRODIURIL   oxyCODONE-acetaminophen 5-325 MG tablet Commonly known as:  PERCOCET/ROXICET     TAKE these medications   acetaminophen 325 MG tablet Commonly known as:  TYLENOL Take 2 tablets (650 mg total) by mouth every 6 (six) hours as needed for mild pain (or Fever >/= 101).   alum & mag hydroxide-simeth 200-200-20 MG/5ML suspension Commonly known as:  MAALOX/MYLANTA Take 30 mLs by mouth every 4 (four) hours as needed for indigestion or heartburn.   aspirin EC 81 MG tablet Take 81 mg by mouth daily.   BIOTIN MAXIMUM STRENGTH 10 MG Tabs Generic drug:  Biotin Take by mouth daily.   Fish Oil 1000 MG Caps Take by mouth daily.   Gerhardt's butt cream Crea Apply 1 application topically 3 (three) times daily.   losartan-hydrochlorothiazide 100-12.5 MG tablet Commonly known as:  HYZAAR Take 1 tablet by mouth daily.   Magnesium 400 MG Caps Take by mouth daily.   metoCLOPramide 5 MG tablet Commonly known as:  REGLAN Take 1 tablet (5 mg total) by mouth every 6 (six) hours as needed for nausea.   oxyCODONE 5 MG immediate release tablet Commonly known as:  Oxy IR/ROXICODONE Take 2 tablets (10 mg total) by mouth every 4 (four) hours as needed for moderate pain.   polyethylene glycol packet Commonly known as:  MIRALAX / GLYCOLAX Take 17 g by mouth 2 (two) times daily.   polyvinyl alcohol 1.4 % ophthalmic solution Commonly known as:  LIQUIFILM TEARS Place 1 drop into both eyes as needed for dry eyes.   predniSONE 10 MG tablet Commonly known as:  DELTASONE Take 1 tablet (10 mg total) by mouth 2 (two) times daily.   senna-docusate 8.6-50 MG tablet Commonly known as:  Senokot-S Take 2 tablets by mouth 2 (two) times daily.   TURMERIC PO Take by mouth daily.   vitamin C 1000 MG tablet Take 1,000 mg by mouth daily.   VITAMIN D-3 PO Take by mouth daily.       Follow-up Information    MD at SNF in 1 week Follow up.        Ladell Pier, MD Follow up in 2 week(s).   Specialty:  Oncology Why:  offcie will call with appt Contact information: [place] [id number] [phone]        Franchot Gallo, MD. Schedule an appointment as soon as possible for a visit in 2 week(s).   Specialty:  Urology Contact information: [place] [id number] [phone]          No Known Allergies    Procedures/Studies: Dg Chest 1 View  Result Date: 10/04/2018 CLINICAL DATA:  Status post left thoracentesis. EXAM: CHEST  1 VIEW COMPARISON:  Radiograph of October 04, 2018. FINDINGS: Stable cardiomediastinal silhouette. No pneumothorax is noted. Left pleural effusion noted on prior exam is significantly decreased. Mild right basilar atelectasis and effusion is noted. Residual left basilar subsegmental atelectasis is noted. Bony thorax is unremarkable. IMPRESSION: Significantly decreased left pleural effusion status post thoracentesis. No pneumothorax is noted. Electronically Signed   By: Marijo Conception, M.D.   On: 10/04/2018 12:40  Dg Chest 2 View  Result Date: 10/02/2018 CLINICAL DATA:  Chest pain. EXAM: CHEST - 2 VIEW COMPARISON:  Radiographs of September 07, 2018. FINDINGS: Stable cardiomediastinal silhouette. No pneumothorax is noted. Mild left pleural effusion is noted with probable underlying atelectasis or infiltrate. Minimal right pleural effusion is noted. Bony thorax is unremarkable. IMPRESSION: Mild left pleural effusion is noted with probable underlying atelectasis or infiltrate. Minimal right pleural effusion. Electronically Signed   By: Marijo Conception, M.D.   On: 10/02/2018 16:22   Ct Head Wo Contrast  Result Date: 10/02/2018 CLINICAL DATA:  Patient with chest pain.  Nausea. EXAM: CT HEAD WITHOUT CONTRAST TECHNIQUE: Contiguous axial images were obtained from the base of the skull through the  vertex without intravenous contrast. COMPARISON:  None. FINDINGS: Brain: Ventricles and sulci are appropriate for patient's age. No evidence for acute cortically based infarct, intracranial hemorrhage, mass lesion or mass-effect. Vascular: Unremarkable Skull: Intact. Sinuses/Orbits: Paranasal sinuses well aerated. Mastoid air cells unremarkable. Orbits unremarkable. Other: None. IMPRESSION: No acute intracranial process. Electronically Signed   By: Lovey Newcomer M.D.   On: 10/02/2018 16:59   Ct Soft Tissue Neck Wo Contrast  Result Date: 10/03/2018 CLINICAL DATA:  Metastatic cancer. EXAM: CT NECK WITHOUT CONTRAST TECHNIQUE: Multidetector CT imaging of the neck was performed following the standard protocol without intravenous contrast. COMPARISON:  None. FINDINGS: Pharynx and larynx: No mucosal or submucosal lesion. Salivary glands: Parotid and submandibular glands are normal. Thyroid: Multiple thyroid nodules. This could be benign goiter. Consider thyroid ultrasound for more accurate evaluation. Lymph nodes: No enlarged or low-density nodes in the neck region. There slightly prominent nodes in the supraclavicular fat on both sides. These are nonspecific. They could represent reactive nodes or malignancy. Vascular: Ordinary atherosclerotic calcification of the carotid bifurcation regions. Limited intracranial: Negative Visualized orbits: Normal Mastoids and visualized paranasal sinuses: Clear Skeleton: Lytic foci scattered throughout the cervical and thoracic spine consistent with osseous metastatic disease. No evidence of pathologic fracture in the cervical region. Possible pathologic fracture at T4. No discernible tumor involvement of the canal, but CT is not sensitive. Upper chest: See results of chest CT Other: None IMPRESSION: Lytic metastatic lesions affecting the cervical and upper thoracic spine. No evidence of pathologic fracture in the cervical region. Probable pathologic fracture of T4. See results of  chest CT. Slightly prominent supraclavicular lymph nodes on each side. No abnormal nodes higher in the neck. Multiple thyroid nodules. Most likely diagnosis is benign goiter, but thyroid ultrasound could be performed for more accurate evaluation if primary carcinoma is not identified elsewhere. In the absence of regional lymph nodes, it would be unusual for this to be thyroid carcinoma. Electronically Signed   By: Nelson Chimes M.D.   On: 10/03/2018 10:55   Ct Chest Wo Contrast  Result Date: 10/03/2018 CLINICAL DATA:  Patient with altered mental status. Lytic lesions within the pelvis. Effusion. EXAM: CT CHEST WITHOUT CONTRAST TECHNIQUE: Multidetector CT imaging of the chest was performed following the standard protocol without IV contrast. COMPARISON:  CT pelvis 10/02/2018; chest radiograph 10/02/2018 FINDINGS: Cardiovascular: Heart is enlarged. Trace pericardial effusion. Thoracic aortic vascular calcification. Mediastinum/Nodes: Prominent 9 mm left axillary lymph node (image 46; series 2). 2.6 x 3.5 cm left subpectoral lymph node. 1.2 cm right axillary lymph node (image 41; series 2). 1.0 cm node superior mediastinum (image 40; series 2). Normal appearance of the esophagus. Heterogeneous thyroid. Lungs/Pleura: Central airways are patent. Moderate left and small right pleural effusions. Subpleural consolidation within the left  and right lower lobes. No pneumothorax. Upper Abdomen: Small volume perihepatic ascites. Musculoskeletal: Posterior left ninth, tenth and eleventh healing rib fractures. Healing posterior right sixth rib fracture. Multiple healing lateral right rib fractures involving the third and seventh ribs. Mixed lucent and sclerotic lesions involving the T1, T2, T4 and T12 vertebral bodies. There is a mild pathologic fracture of the T4 vertebral body with associated height loss. Prior left mastectomy. Small lucent lesions involving the ribs bilaterally. IMPRESSION: 1. Large left subpectoral lymph  node. Additionally there is prominent left axillary and enlarged right axillary adenopathy. Findings are concerning for metastatic disease. 2. Lytic lesions involving the thoracic spine and ribs concerning for osseous metastatic disease. There is mild height loss of the T4 vertebral body most compatible with pathologic fracture. 3. Moderate left and small right layering pleural effusions. There is underlying consolidation within the adjacent pulmonary parenchyma which may represent atelectasis or infection. 4. Aortic Atherosclerosis (ICD10-I70.0). Electronically Signed   By: Lovey Newcomer M.D.   On: 10/03/2018 12:54   Ct Pelvis Wo Contrast  Result Date: 10/02/2018 CLINICAL DATA:  Left sacroiliac joint pain. EXAM: CT PELVIS WITHOUT CONTRAST TECHNIQUE: Multidetector CT imaging of the pelvis was performed following the standard protocol without intravenous contrast. COMPARISON:  CT scan of February 17, 2014. FINDINGS: Urinary Tract: Wall thickening is seen involving superior portion of urinary bladder. Bowel: Sigmoid diverticulosis is noted. There is no evidence of bowel obstruction. Wall thickening of distal sigmoid colon and rectum is noted with surrounding inflammation consistent with proctocolitis or possibly neoplasm. Vascular/Lymphatic: Atherosclerosis of abdominal aorta is noted. Left inguinal lymph node measuring 1.1 cm is noted. 9 mm left common iliac lymph node is noted. Reproductive: No mass or other significant abnormality. Status post hysterectomy. Other:  No hernia or abnormal fluid collection is noted. Musculoskeletal: Multiple lytic lesions are seen in the pelvis and sacrum, consistent with metastatic disease. Largest lesion is seen posteriorly in right sacrum. IMPRESSION: Multiple lytic lesions are noted in the pelvis and sacrum, with the largest seen posteriorly in right sacrum. These are consistent with metastatic disease or possibly multiple myeloma. Wall thickening of distal sigmoid colon and rectum  is noted with surrounding inflammation most consistent with proctocolitis, although neoplasm cannot be excluded. Sigmoidoscopy is recommended for further evaluation. Left inguinal and common iliac adenopathy is noted which may be inflammatory in etiology, but malignancy cannot be excluded. Wall thickening is seen involving the superior portion of urinary bladder; neoplasm cannot be excluded and cystoscopy is recommended. Electronically Signed   By: Marijo Conception, M.D.   On: 10/02/2018 17:06   US Renal  Result Date: 10/05/2018 CLINICAL DATA:  Hydronephrosis. EXAM: RENAL / URINARY TRACT ULTRASOUND COMPLETE COMPARISON:  Ultrasound 10/04/2018. FINDINGS: Right Kidney: Right nephrectomy. Left Kidney: Renal measurements: 12.1 x 6.0 x 6.7 cm = volume: 254.2 cc mL. Echogenicity within normal limits. Mild hydronephrosis. 2.2 cm simple cyst. Bladder: Foley catheter noted bladder.  Bladder is nondistended. Bilateral pleural effusions noted.  Ascites noted. IMPRESSION: 1. Mild to moderate left hydronephrosis. Similar findings noted on prior exam. 2.  Prior right nephrectomy. Electronically Signed   By: Marcello Moores  Register   On: 10/05/2018 13:40   US Renal  Result Date: 10/03/2018 CLINICAL DATA:  Decreased urine output. History of RIGHT nephrectomy. EXAM: RENAL / URINARY TRACT ULTRASOUND COMPLETE COMPARISON:  CT pelvis dated 10/02/2018. FINDINGS: Right Kidney: Surgically removed. Left Kidney: Renal measurements: 11.9 x 6.2 x 6.2 cm = volume: 240 mL. Mild to moderate hydronephrosis. LEFT renal  cyst measures 2.4 cm. Bladder: Appears normal for degree of bladder distention. LEFT ureteral jet is visualized. IMPRESSION: 1. LEFT renal hydronephrosis, mild to moderate in degree. Suspect some degree of associated obstruction at the level of the bladder wall thickening as described on recent pelvis CT of 10/02/2018. However, a LEFT ureteral jet is visualized at the level of the bladder indicating patency of the LEFT UVJ. 2.  Status post RIGHT nephrectomy. Electronically Signed   By: Franki Cabot M.D.   On: 10/03/2018 18:54   US Venous Img Lower Bilateral  Result Date: 09/27/2018 CLINICAL DATA:  Bilateral lower extremity edema and pain. EXAM: BILATERAL LOWER EXTREMITY VENOUS DOPPLER ULTRASOUND TECHNIQUE: Gray-scale sonography with graded compression, as well as color Doppler and duplex ultrasound were performed to evaluate the lower extremity deep venous systems from the level of the common femoral vein and including the common femoral, femoral, profunda femoral, popliteal and calf veins including the posterior tibial, peroneal and gastrocnemius veins when visible. The superficial great saphenous vein was also interrogated. Spectral Doppler was utilized to evaluate flow at rest and with distal augmentation maneuvers in the common femoral, femoral and popliteal veins. COMPARISON:  None. FINDINGS: RIGHT LOWER EXTREMITY Common Femoral Vein: No evidence of thrombus. Normal compressibility, respiratory phasicity and response to augmentation. Saphenofemoral Junction: No evidence of thrombus. Normal compressibility and flow on color Doppler imaging. Profunda Femoral Vein: No evidence of thrombus. Normal compressibility and flow on color Doppler imaging. Femoral Vein: No evidence of thrombus. Normal compressibility, respiratory phasicity and response to augmentation. Popliteal Vein: No evidence of thrombus. Normal compressibility, respiratory phasicity and response to augmentation. Calf Veins: No evidence of thrombus. Normal compressibility and flow on color Doppler imaging. Superficial Great Saphenous Vein: No evidence of thrombus. Normal compressibility. Venous Reflux:  None. Other Findings: No evidence of superficial thrombophlebitis or abnormal fluid collection. LEFT LOWER EXTREMITY Common Femoral Vein: No evidence of thrombus. Normal compressibility, respiratory phasicity and response to augmentation. Saphenofemoral Junction: No  evidence of thrombus. Normal compressibility and flow on color Doppler imaging. Profunda Femoral Vein: No evidence of thrombus. Normal compressibility and flow on color Doppler imaging. Femoral Vein: No evidence of thrombus. Normal compressibility, respiratory phasicity and response to augmentation. Popliteal Vein: No evidence of thrombus. Normal compressibility, respiratory phasicity and response to augmentation. Calf Veins: No evidence of thrombus. Normal compressibility and flow on color Doppler imaging. Superficial Great Saphenous Vein: No evidence of thrombus. Normal compressibility. Venous Reflux:  None. Other Findings: No evidence of superficial thrombophlebitis or abnormal fluid collection. IMPRESSION: No evidence of bilateral lower extremity deep venous thrombosis. Electronically Signed   By: Aletta Edouard M.D.   On: 09/27/2018 16:42   Dg Chest Port 1 View  Result Date: 10/06/2018 CLINICAL DATA:  Shortness of breath.  Follow-up exam. EXAM: PORTABLE CHEST 1 VIEW COMPARISON:  10/05/2018 FINDINGS: There is persistent left lung base opacity consistent with combination pleural fluid with atelectasis pneumonia. Mild hazy opacity at the right lung base is consistent a small effusion with atelectasis. Are prominent interstitial markings bilaterally without overt pulmonary edema. No new lung abnormalities. No pneumothorax IMPRESSION: No significant change from the prior day's study allowing for differences in patient positioning. Persistent left basilar opacity consistent combination of pleural fluid and atelectasis or pneumonia. Minor right pleural effusion with atelectasis Electronically Signed   By: Lajean Manes M.D.   On: 10/06/2018 08:38   Dg Chest Port 1 View  Result Date: 10/05/2018 CLINICAL DATA:  Shortness of breath EXAM: PORTABLE CHEST 1 VIEW  COMPARISON:  10/04/2018 FINDINGS: Cardiac shadow is stable. The lungs are well aerated bilaterally. Small bilateral pleural effusions are again seen and  stable. Left basilar atelectatic changes are noted slightly greater than that seen on the prior exam. No pneumothorax is noted. IMPRESSION: Slight increase in left basilar atelectasis. Persistent small pleural effusions. Electronically Signed   By: Inez Catalina M.D.   On: 10/05/2018 08:26   Dg Chest Port 1 View  Result Date: 10/04/2018 CLINICAL DATA:  84 year old female. Pleural effusion. Subsequent encounter. EXAM: PORTABLE CHEST 1 VIEW COMPARISON:  10/03/2018 chest CT and 10/02/2018 chest x-ray. FINDINGS: Rotation to the right. When compared to prior chest x-ray, progressive pulmonary vascular congestion/pulmonary edema. Increase in size of left-sided pleural effusion. Left base consolidation may represent atelectasis or infiltrate. Cardiomegaly. IMPRESSION: 1. Progressive pulmonary vascular congestion/pulmonary edema. 2. Increase in size of left-sided pleural effusion. Left base consolidation may represent atelectasis or infiltrate. 3. Cardiomegaly. Electronically Signed   By: Genia Del M.D.   On: 10/04/2018 07:03   [place] Core Biopsy (lymph Nodes)  Result Date: 10/05/2018 INDICATION: 84 year old female with a history of left breast carcinoma. Likely metastatic/recurrent disease to the lymph nodes. She presents for biopsy EXAM: ULTRASOUND GUIDED BIOPSY SUBPECTORAL LYMPH NODE MEDICATIONS: None. ANESTHESIA/SEDATION: Moderate (conscious) sedation was employed during this procedure. A total of Versed 0.5 mg and Fentanyl 25 mcg was administered intravenously. Moderate Sedation Time: 10 minutes. The patient's level of consciousness and vital signs were monitored continuously by radiology nursing throughout the procedure under my direct supervision. FLUOROSCOPY TIME:  None COMPLICATIONS: None PROCEDURE: Informed written consent was obtained from the patient after a thorough discussion of the procedural risks, benefits and alternatives. All questions were addressed. Maximal Sterile Barrier Technique was  utilized including caps, mask, sterile gowns, sterile gloves, sterile drape, hand hygiene and skin antiseptic. A timeout was performed prior to the initiation of the procedure. Patient positioned supine position on the ultrasound table. Images of the left chest were performed with images stored sent to PACs. The patient is prepped and draped in the usual sterile fashion. 1% lidocaine was used for local anesthesia. Using ultrasound guidance, guide needle was advanced into the subpectoral pathologic lymph node. Multiple 18 gauge core biopsy were performed. Needles removed. Final image was stored. Patient tolerated the procedure well and remained hemodynamically stable throughout. No complications were encountered and no significant blood loss. IMPRESSION: Status post ultrasound-guided biopsy of left subpectoral lymph node. Tissue specimen sent to pathology for complete histopathologic analysis. Signed, Dulcy Fanny. Dellia Nims, RPVI [facility] Electronically Signed   By: Corrie Mckusick D.O.   On: 10/05/2018 12:59   US Thoracentesis Asp Pleural Space W/img Guide  Result Date: 10/04/2018 INDICATION: Patient with remote history of breast cancer, lytic bony lesions, adenopathy, bilateral pleural effusions; request received for diagnostic and therapeutic left thoracentesis. EXAM: ULTRASOUND GUIDED DIAGNOSTIC AND THERAPEUTIC LEFT THORACENTESIS MEDICATIONS: None COMPLICATIONS: None immediate. PROCEDURE: An ultrasound guided thoracentesis was thoroughly discussed with the patient/spouse/daughter and questions answered. The benefits, risks, alternatives and complications were also discussed. The patient understands and wishes to proceed with the procedure. Written consent was obtained. Ultrasound was performed to localize and mark an adequate pocket of fluid in the left chest. The area was then prepped and draped in the normal sterile fashion. 1% Lidocaine was used for  local anesthesia. Under ultrasound guidance a 6 Fr Safe-T-Centesis catheter was introduced. Thoracentesis was performed. The catheter was removed and a dressing applied. FINDINGS: A total  of approximately 930 cc of slightly hazy, yellow fluid was removed. Samples were sent to the laboratory as requested by the clinical team. IMPRESSION: Successful ultrasound guided diagnostic and therapeutic left thoracentesis yielding 930 cc of pleural fluid. Read by: Rowe Robert, PA-C Electronically Signed   By: Markus Daft M.D.   On: 10/04/2018 13:08       Subjective: Reports still being constipated.  Pain better after switching to oxycodone.  Discharge Exam: Vitals:   10/08/18 2016 10/09/18 0543  BP: (!) 143/52 (!) 148/62  Pulse: 88 93  Resp: 18 (!) 22  Temp: (!) 97.5 F (36.4 C) 98.9 F (37.2 C)  SpO2: 94% 91%   Vitals:   10/08/18 0506 10/08/18 1338 10/08/18 2016 10/09/18 0543  BP: (!) 160/60 (!) 129/48 (!) 143/52 (!) 148/62  Pulse: 96 90 88 93  Resp: _0 (!) 22  Temp: 98.2 F (36.8 C) 97.8 F (36.6 C) (!) 97.5 F (36.4 C) 98.9 F (37.2 C)  TempSrc: Oral  Oral Oral  SpO2: 95% 94% 94% 91%  Weight:    51.4 kg  Height:        General: Fatigued, not in distress HEENT: Pallor present, moist mucosa, supple neck Chest: Clear bilaterally CVS: Normal S1 and S2, no murmurs GI: Soft, nondistended, nontender, bowel sounds present, Foley draining clear urine Musculoskeletal: Warm, no edema CNS: Alert and oriented    The results of significant diagnostics from this hospitalization (including imaging, microbiology, ancillary and laboratory) are listed below for reference.     Microbiology: Recent Results (from the past 240 hour(s))  Urine Culture     Status: None   Collection Time: 10/02/18  7:20 PM  Result Value Ref Range Status   Specimen Description   Final    URINE, CLEAN CATCH Performed at [facility], [place] [address], [place], [place] [address]    Special  Requests   Final    NONE Performed at [facility], [place] [address] [place], [place] [address]    Culture   Final    NO GROWTH Performed at [facility], [place] [address] [place], [place] [address]    Report Status 10/04/2018 FINAL  Final  Acid Fast Smear (AFB)     Status: None   Collection Time: 10/04/18  1:01 PM  Result Value Ref Range Status   AFB Specimen Processing Concentration  Final   Acid Fast Smear Negative  Final    Comment: (NOTE) Performed At: [facility] [place], [place] [id number] Rush Farmer MD [id number]    Source (AFB) PLEURAL  Final    Comment: LEFT Performed at [facility], [place] [address] [place], [place] [address]   Culture, body fluid-bottle     Status: None   Collection Time: 10/04/18  1:01 PM  Result Value Ref Range Status   Specimen Description PLEURAL  Final   Special Requests NONE  Final   Culture   Final    NO GROWTH 5 DAYS Performed at [facility], [address] [place], [place] [address]    Report Status 10/09/2018 FINAL  Final  Gram stain     Status: None   Collection Time: 10/04/18  1:01 PM  Result Value Ref Range Status   Specimen Description PLEURAL  Final   Special Requests NONE  Final   Gram Stain   Final    WBC PRESENT,BOTH PMN AND MONONUCLEAR NO ORGANISMS SEEN CYTOSPIN SMEAR  Performed at [facility], [place] [address] [place], [place] [address]    Report Status 10/04/2018 FINAL  Final     Labs: BNP (last 3 results) Recent Labs    10/04/18 0549  BNP 932.3*   Basic Metabolic Panel: Recent Labs  Lab 10/03/18 1606 10/04/18 0549 10/05/18 0624 10/06/18 0545 10/08/18 1517  NA 132* 132* 132* 134* 140  K 3.5 3.8 3.6 4.2 4.1  CL 98 101 100 100 107  CO2 _0 20* 29  GLUCOSE 96 85 68* 117* 147*  BUN 27* 26* 30* 34* 34*  CREATININE 1.04* 1.13* 1.12* 1.05* 0.83  CALCIUM 12.6* 11.5* 10.6* 10.1 9.4  MG  --  1.7 1.7 2.2  --   PHOS  --   3.1 2.8 2.8  --    Liver Function Tests: Recent Labs  Lab 10/02/18 1529 10/03/18 0542 10/04/18 0549 10/05/18 0624 10/06/18 0545  AST 69* 64* 62* 63* 69*  ALT _1 33  ALKPHOS 124 104 93 93 109  BILITOT 0.8 0.6 0.5 0.5 0.9  PROT 7.0 5.9* 5.3* 5.6* 6.1*  ALBUMIN 3.2* 2.6* 2.3* 2.4* 2.5*   No results for input(s): LIPASE, AMYLASE in the last 168 hours. No results for input(s): AMMONIA in the last 168 hours. CBC: Recent Labs  Lab 10/02/18 1529 10/03/18 0542 10/04/18 0549 10/05/18 0624 10/06/18 0545  WBC 12.9* 11.4* 14.0* 13.0* 14.1*  NEUTROABS 9.9*  --  11.7* 10.7* 12.2*  HGB 10.5* 10.1* 9.5* 9.8* 10.4*  HCT 34.0* 32.0* 31.5* 32.4* 33.6*  MCV 84.8 84.4 85.1 85.3 84.6  PLT 323 320 300 320 387   Cardiac Enzymes: No results for input(s): CKTOTAL, CKMB, CKMBINDEX, TROPONINI in the last 168 hours. BNP: Invalid input(s): POCBNP CBG: No results for input(s): GLUCAP in the last 168 hours. D-Dimer No results for input(s): DDIMER in the last 72 hours. Hgb A1c No results for input(s): HGBA1C in the last 72 hours. Lipid Profile No results for input(s): CHOL, HDL, LDLCALC, TRIG, CHOLHDL, LDLDIRECT in the last 72 hours. Thyroid function studies Recent Labs    10/06/18 1647  TSH 1.999   Anemia work up No results for input(s): VITAMINB12, FOLATE, FERRITIN, TIBC, IRON, RETICCTPCT in the last 72 hours. Urinalysis    Component Value Date/Time   COLORURINE YELLOW 10/02/2018 1922   APPEARANCEUR CLEAR 10/02/2018 1922   LABSPEC 1.011 10/02/2018 1922   PHURINE 8.0 10/02/2018 1922   GLUCOSEU NEGATIVE 10/02/2018 [place] NEGATIVE 10/02/2018 [place] NEGATIVE 10/02/2018 1922   KETONESUR 5 (A) 10/02/2018 [place] NEGATIVE 10/02/2018 1922   NITRITE NEGATIVE 10/02/2018 1922   LEUKOCYTESUR MODERATE (A) 10/02/2018 1922   Sepsis Labs Invalid input(s): PROCALCITONIN,  WBC,  LACTICIDVEN Microbiology Recent Results (from the past 240 hour(s))  Urine  Culture     Status: None   Collection Time: 10/02/18  7:20 PM  Result Value Ref Range Status   Specimen Description   Final    URINE, CLEAN CATCH Performed at [facility], [place] [address] [place], [place] [address]    Special Requests   Final    NONE Performed at [facility], [place] [address] [place], [place] [address]    Culture   Final    NO GROWTH Performed at [facility], [place] [address], [place], [place] [address]    Report Status 10/04/2018 FINAL  Final  Acid Fast Smear (AFB)     Status:  None   Collection Time: 10/04/18  1:01 PM  Result Value Ref Range Status   AFB Specimen Processing Concentration  Final   Acid Fast Smear Negative  Final    Comment: (NOTE) Performed At: [facility] [place], [place] [id number] Rush Farmer MD [id number]    Source (AFB) PLEURAL  Final    Comment: LEFT Performed at [place] [address] [place], [place] [address]   Culture, body fluid-bottle     Status: None   Collection Time: 10/04/18  1:01 PM  Result Value Ref Range Status   Specimen Description PLEURAL  Final   Special Requests NONE  Final   Culture   Final    NO GROWTH 5 DAYS Performed at [facility], [address], [place], [place] [address]    Report Status 10/09/2018 FINAL  Final  Gram stain     Status: None   Collection Time: 10/04/18  1:01 PM  Result Value Ref Range Status   Specimen Description PLEURAL  Final   Special Requests NONE  Final   Gram Stain   Final    WBC PRESENT,BOTH PMN AND MONONUCLEAR NO ORGANISMS SEEN CYTOSPIN SMEAR Performed at [facility], [address] [place], [place] [address]    Report Status 10/04/2018 FINAL  Final     Time coordinating discharge: 35 minutes  SIGNED:   Louellen Molder, MD  [facility] 10/09/2018, 11:20 AM Pager   If 7PM-7AM, please contact night-coverage [email or web address] Password [email or web address]

## 2018-10-12 ENCOUNTER — Encounter: Payer: Self-pay | Admitting: Internal Medicine

## 2018-10-12 ENCOUNTER — Non-Acute Institutional Stay: Payer: Medicare Other | Admitting: Internal Medicine

## 2018-10-12 VITALS — BP 134/70 | HR 80 | Resp 20 | Ht 63.0 in | Wt 111.0 lb

## 2018-10-12 DIAGNOSIS — K5903 Drug induced constipation: Secondary | ICD-10-CM

## 2018-10-12 DIAGNOSIS — R52 Pain, unspecified: Secondary | ICD-10-CM

## 2018-10-12 DIAGNOSIS — R531 Weakness: Secondary | ICD-10-CM

## 2018-10-12 DIAGNOSIS — R110 Nausea: Secondary | ICD-10-CM

## 2018-10-12 DIAGNOSIS — T402X5A Adverse effect of other opioids, initial encounter: Secondary | ICD-10-CM

## 2018-10-12 NOTE — Progress Notes (Signed)
[facility] Telephone: [phone] Fax: [phone]  PATIENT NAME: [name] DOB: 02/27/1929 MRN: [id number]  PRIMARY CARE PROVIDER:   Janie Morning, DO  Dr. Seward Carol Dr. Benay Spice (oncology) apt nxt tues. Dr. Eileen Stanford (urology); discharged with Foley catheter. REFERRING PROVIDER: Suzzanne Cloud PA-C RESPONSIBLE PARTY:   (son) Lyndel Dancel [phone], (dtr) Milana Obey [phone], (dtr) Laura Martinique [phone] 84 yo   HISTORY OF PRESENT ILLNESS:  [name] is a 84 y.o. female with h/o skin and breast cancer (DCIS; L mastectomy 2001), HTN,barrett esophagus, IBS,OA,OP, pre-DM, and  Hyperlipidemia. She was admitted to [facility] from 11/9-11/16/19 for evaluation of altered mental status r/t hypercalcemia. She has a soft tissue biopsy suggestive of metastatic (lytc lesions to pelvis and sacrum) breast cancer. She had bilateral bleural effusions, and underwent a L thoracentesis of 1 liter (10/04/2018).  She was discharged on prednisone and Oxy IR for pain control. She was admitted to [facility] rm 3218 to continue physical therapy. Palliative Care was consulted to assist with Advance Care planning discussion, symptom management, determine goals of care for patient and family, and end-of-life decision making.  ASSESSMENT / RECOMMENDATIONA:     1. Weakness: Continues with marked fatigue. Working with facility physical therapy. Needs light assist for transfers but ambulating with walker short distances. Family wish to complete rehab stay allotment to optimize function. This will also buy family time to make arrangements for adequate home services. Her Hgb (10/11/18) was 9.8.   2. Lower back pain likely related to bony mets: Fairly adequate management on current pain regimen (oxycodone and prednisone). If becomes sub-optimal would could consider change to bid long acting opioid with prn oxy IR for breakthrough pain.   3. Opioid induced constipation: well managed  with prn MiraLax  4. Nausea/weight loss: precipitated by certain body movements, perfume or food smells. No emesis but episodic dry heaves. She is premedicating before meals with reglan. Diminished appetite, with oral intake limited to bites and sips.  Her current weight 111 lbs (10/08/18). At height of 5'3", her BMI is 19.7g/m2. Serum albumin (10/11/18) was 3.2. -Consider addition of compazine 10 mg po/pr q6h prn nausea  5. Advanced Care Planning: DNR is on chart. Discussed and updated MOST form with patient, spouse, and daughter [name]: Details: DNR/DNI. Comfort level of care. IVF and antibiotics: determine at the time of need. No tube feedings.   6. Goals of Care: one of Debbie's (dtr) daughters is an Oncology NP, another daughter is a Engineer, drilling. They have been a good source of information and advice.  -discharge to home with services after completes course of PT at [facility].   -Patient has f/u OV with Dr. Benay Spice (oncology) Nov 26th. Family may consider Hospice referral pending his outlined options, and level of patient's  functional abilities/nutritional status/symptoms.    7. PC NP follow up: before discharge from SNF  Or at the home shortly after discharge  I spent 75 minutes providing this consultation,  from 11:30am to 12:45pm. More than 50% of the time in this consultation was spent coordinating communication.   CODE STATUS: DNR / DNI. MOST: Comfort level of care. IVF and antibiotics: determine at the time of need. No tube feedings.   PPS: 30% to a weak 40%  HOSPICE ELIGIBILITY/DIAGNOSIS: yes, pending decisions regarding future treatment.  PAST MEDICAL HISTORY:  Past Medical History:  Diagnosis Date  . Barrett esophagus   . Hx of skin cancer, basal cell   . HX: breast  cancer   . Hyperlipemia   . Hypertension   . IBS (irritable bowel syndrome)   . Neck pain   . Osteoarthritis   . Osteoporosis   . Prediabetes     SOCIAL HX:  Social History   Tobacco Use  . Smoking  status: Former Smoker    Types: Cigarettes  . Smokeless tobacco: Never Used  . Tobacco comment: Quit 1970  Substance Use Topics  . Alcohol use: Yes    Alcohol/week: 0.0 standard drinks    Comment: Occasional glass of wine    ALLERGIES: MSO4: nausea (new Nov 2019) PERTINENT MEDICATIONS:  Outpatient Encounter Medications as of 103/22/202019  Medication Sig  . acetaminophen (TYLENOL) 325 MG tablet Take 2 tablets (650 mg total) by mouth every 6 (six) hours as needed for mild pain (or Fever >/= 101).  Marland Kitchen alum & mag hydroxide-simeth (MAALOX/MYLANTA) 200-200-20 MG/5ML suspension Take 30 mLs by mouth every 4 (four) hours as needed for indigestion or heartburn.  . Ascorbic Acid (VITAMIN C) 1000 MG tablet Take 1,000 mg by mouth daily.  . Biotin (BIOTIN MAXIMUM STRENGTH) 10 MG TABS Take by mouth daily.  . Cholecalciferol (VITAMIN D-3 PO) Take by mouth daily.  Marland Kitchen losartan-hydrochlorothiazide (HYZAAR) 100-12.5 MG tablet Take 1 tablet by mouth daily.  . Magnesium 400 MG CAPS Take by mouth daily.  . metoCLOPramide (REGLAN) 5 MG tablet Take 1 tablet (5 mg total) by mouth every 6 (six) hours as needed for nausea.  Marland Kitchen oxyCODONE (OXY IR/ROXICODONE) 5 MG immediate release tablet Take 2 tablets (10 mg total) by mouth every 4 (four) hours as needed for moderate pain.  . polyethylene glycol (MIRALAX / GLYCOLAX) packet Take 17 g by mouth 2 (two) times daily.  . polyvinyl alcohol (LIQUIFILM TEARS) 1.4 % ophthalmic solution Place 1 drop into both eyes as needed for dry eyes.  . predniSONE (DELTASONE) 10 MG tablet Take 1 tablet (10 mg total) by mouth 2 (two) times daily.  Marland Kitchen senna-docusate (SENOKOT-S) 8.6-50 MG tablet Take 2 tablets by mouth 2 (two) times daily.  . TURMERIC PO Take by mouth daily.  . [DISCONTINUED] aspirin EC 81 MG tablet Take 81 mg by mouth daily.  . [DISCONTINUED] Hydrocortisone (GERHARDT'S BUTT CREAM) CREA Apply 1 application topically 3 (three) times daily. (Patient not taking: Reported on  103/22/202019)  . [DISCONTINUED] Omega-3 Fatty Acids (FISH OIL) 1000 MG CAPS Take by mouth daily.   No facility-administered encounter medications on file as of 103/22/202019.     PHYSICAL EXAM:  VS: BP 134/70, HR 80, RR 20 General: NAD, frail appearing, thin Cardiovascular: regular rate and rhythm Pulmonary: clear ant fields. Left base egophony Abdomen: soft, nontender, + bowel sounds GU: no suprapubic tenderness Extremities: no edema, no joint deformities Skin: no rashes Neurological: Weakness but otherwise nonfocal  Julianne Handler, NP

## 2018-10-13 DIAGNOSIS — L89322 Pressure ulcer of left buttock, stage 2: Secondary | ICD-10-CM | POA: Diagnosis not present

## 2018-10-13 DIAGNOSIS — C7989 Secondary malignant neoplasm of other specified sites: Secondary | ICD-10-CM | POA: Diagnosis not present

## 2018-10-13 DIAGNOSIS — Z853 Personal history of malignant neoplasm of breast: Secondary | ICD-10-CM | POA: Diagnosis not present

## 2018-10-13 DIAGNOSIS — N1330 Unspecified hydronephrosis: Secondary | ICD-10-CM | POA: Diagnosis not present

## 2018-10-13 DIAGNOSIS — G9341 Metabolic encephalopathy: Secondary | ICD-10-CM | POA: Diagnosis not present

## 2018-10-13 DIAGNOSIS — J90 Pleural effusion, not elsewhere classified: Secondary | ICD-10-CM | POA: Diagnosis not present

## 2018-10-19 ENCOUNTER — Inpatient Hospital Stay: Payer: Medicare Other | Attending: Nurse Practitioner | Admitting: Nurse Practitioner

## 2018-10-19 ENCOUNTER — Telehealth: Payer: Self-pay | Admitting: Nurse Practitioner

## 2018-10-19 ENCOUNTER — Inpatient Hospital Stay: Payer: Medicare Other

## 2018-10-19 ENCOUNTER — Encounter: Payer: Self-pay | Admitting: Nurse Practitioner

## 2018-10-19 DIAGNOSIS — C801 Malignant (primary) neoplasm, unspecified: Secondary | ICD-10-CM

## 2018-10-19 DIAGNOSIS — Z86000 Personal history of in-situ neoplasm of breast: Secondary | ICD-10-CM

## 2018-10-19 DIAGNOSIS — D649 Anemia, unspecified: Secondary | ICD-10-CM | POA: Diagnosis not present

## 2018-10-19 DIAGNOSIS — R110 Nausea: Secondary | ICD-10-CM | POA: Diagnosis not present

## 2018-10-19 LAB — CMP (CANCER CENTER ONLY)
ALK PHOS: 129 U/L — AB (ref 38–126)
ALT: 15 U/L (ref 0–44)
AST: 45 U/L — AB (ref 15–41)
Albumin: 2.5 g/dL — ABNORMAL LOW (ref 3.5–5.0)
Anion gap: 9 (ref 5–15)
BILIRUBIN TOTAL: 0.3 mg/dL (ref 0.3–1.2)
BUN: 13 mg/dL (ref 8–23)
CO2: 24 mmol/L (ref 22–32)
CREATININE: 0.75 mg/dL (ref 0.44–1.00)
Calcium: 8 mg/dL — ABNORMAL LOW (ref 8.9–10.3)
Chloride: 99 mmol/L (ref 98–111)
GFR, Est AFR Am: >60 mL/min (ref >60)
GLUCOSE: 131 mg/dL — AB (ref 70–99)
Potassium: 3.2 mmol/L — ABNORMAL LOW (ref 3.5–5.1)
Sodium: 132 mmol/L — ABNORMAL LOW (ref 135–145)
TOTAL PROTEIN: 5.9 g/dL — AB (ref 6.5–8.1)

## 2018-10-19 NOTE — Telephone Encounter (Signed)
Gave pt avs and calendar  °

## 2018-10-19 NOTE — Progress Notes (Signed)
[place] OFFICE PROGRESS NOTE   Diagnosis:    INTERVAL HISTORY:   [name] returns for follow-up.  She is residing at a nursing facility.  She is participating in occupational and physical therapy.  She is ambulating with a walker.  She notes pain in various locations throughout the day.  She estimates taking oxycodone about 3 times a day.  She has intermittent dry heaves.  At times this seems to be motion related.  She continues Reglan.  Appetite is poor.  Bowels are moving.  Foley catheter has been removed.  She denies dysphagia.  No shortness of breath.  She thinks she had a single episode of hematuria in the past possibly related to a urinary tract infection.  Objective:  Vital signs in last 24 hours:  Blood pressure (!) 131/54, pulse 91, temperature 98.4 F (36.9 C), temperature source Oral, resp. rate 19, height '5\' 3"'$  (1.6 m), weight 115 lb 8 oz (52.4 kg), SpO2 96 %.    HEENT: No thrush or ulcers. Resp: Lungs clear bilaterally. Cardio: Regular rate and rhythm. GI: Abdomen soft and nontender.  No hepatomegaly. Vascular: Trace edema at the lower legs/ankles bilaterally. Neuro: Alert and oriented.   Lab Results:  Lab Results  Component Value Date   WBC 14.1 (H) 10/06/2018   HGB 10.4 (L) 10/06/2018   HCT 33.6 (L) 10/06/2018   MCV 84.6 10/06/2018   PLT 387 10/06/2018   NEUTROABS 12.2 (H) 10/06/2018    Imaging:  No results found.  Medications: I have reviewed the patient's current medications.  Assessment/Plan: 1.Hypercalcemia-status post Zometa on 10/02/2018, improved 2.Lytic bone lesions left subpectoral node, rectal and bladder thickening on CTs 10/02/2018 and 10/03/2018  Ultrasound biopsy left subpectoral lymph node 10/05/2018- metastatic carcinoma with initial histology consistent with metastatic breast cancer; ER negative, PR negative and HER-2 negative.  Additional immunohistochemistry showed cytokeratin 903 positive and GCDFP-15  negative. 3.  Nausea and vomiting 10/08/2018-etiology unclear, potentially related to morphine 4.History of left breast DCIS, status post a left mastectomy in 2001 5.Altered mental status secondary to #1 6.History of Barrett's esophagus 7.Osteoporosis 8.Nephrectomy in 1955 9.Pain secondary to lytic bone lesions 10.Left neck mass 11.Anemia 12. Bilateral pleural effusions-status post a left thoracentesis 10/04/2018; initial cytology reactive mesothelial cells, mixed lymphoid population.  Additional immunostains showed cells with positive staining for pancytokeratin and GATA-3.  Disposition: [name] has been diagnosed with metastatic carcinoma of unknown primary.  Dr. Benay Spice reviewed the diagnosis and prognosis with [name] and her family at today's visit.  She is not interested in further testing to attempt to identify the primary site.  She declines chemotherapy.  We will request MSI testing on the left subpectoral lymph node biopsy to see if she may be a candidate for immunotherapy.  We reviewed the chemistry panel from today.  The calcium when corrected, is in normal range.  We will repeat at the time of her next visit.  For the intermittent nausea she will try Zofran 8 mg every 8 hours as needed and also begin Protonix 40 mg daily.  We discontinued multiple nonessential medications at today's visit.  Prednisone was adjusted to 10 mg daily.  She will return for a chemistry panel and follow-up visit in approximately 3 weeks.  She will contact the office in the interim with any problems.  Patient seen with Dr. Benay Spice.  25 minutes were spent face-to-face at today's visit with the majority of that time involved in counseling/coordination of care.  Ned Card ANP/GNP-BC   10/19/2018  10:45 AM This was a shared visit with Ned Card.  [name] was interviewed and examined.  We discussed the final pathology with [name] and her family.  She understands the pathology  is consistent with metastatic carcinoma.  There is no clear primary tumor site.  The differential diagnosis includes bladder and gastrointestinal cancers. Hormone receptor negative breast cancer remains a possibility.  [name] does not wish to undergo further diagnostic interventions to look for a primary tumor site.  She does not wish to receive chemotherapy.  She would like to continue comfort care.  Her performance status has improved significantly with control of the hypercalcemia.  She will return for an office visit and repeat calcium level in approximately 3 weeks.  We adjusted her medical regimen.  We added antiemetic and antacid therapy.  Julieanne Manson MD.

## 2018-10-20 DIAGNOSIS — D638 Anemia in other chronic diseases classified elsewhere: Secondary | ICD-10-CM | POA: Diagnosis not present

## 2018-10-20 DIAGNOSIS — R338 Other retention of urine: Secondary | ICD-10-CM | POA: Diagnosis not present

## 2018-10-20 DIAGNOSIS — C7989 Secondary malignant neoplasm of other specified sites: Secondary | ICD-10-CM | POA: Diagnosis not present

## 2018-10-20 DIAGNOSIS — L89322 Pressure ulcer of left buttock, stage 2: Secondary | ICD-10-CM | POA: Diagnosis not present

## 2018-10-22 ENCOUNTER — Telehealth: Payer: Self-pay | Admitting: *Deleted

## 2018-10-22 MED ORDER — ONDANSETRON HCL 8 MG PO TABS: 8.0000 mg | ORAL_TABLET | Freq: Three times a day (TID) | ORAL | 1 refills | Status: AC | PRN

## 2018-10-22 MED ORDER — PANTOPRAZOLE SODIUM 40 MG PO TBEC: 40.0000 mg | DELAYED_RELEASE_TABLET | Freq: Every day | ORAL | 1 refills | Status: AC

## 2018-10-22 MED ORDER — LOSARTAN POTASSIUM-HCTZ 100-12.5 MG PO TABS: 1.0000 | ORAL_TABLET | Freq: Every day | ORAL | 0 refills | Status: AC

## 2018-10-22 MED ORDER — OXYCODONE HCL 5 MG PO TABS: 10.0000 mg | ORAL_TABLET | ORAL | 0 refills | Status: DC | PRN

## 2018-10-22 NOTE — Telephone Encounter (Signed)
Being discharged from [facility] tomorrow and will need some scripts called in to her pharmacy. She will need to pick up script for oxycodone. Called Lauren, CSW at [facility] [phone]) and she sent copy of discharge meds and how many of them will be dispensed for her to take with her.  Refilled the ondansetron, pantoprazole, losartan-hctz and daughter will pick up the oxycodone today before 5 pm.

## 2018-10-24 DIAGNOSIS — D638 Anemia in other chronic diseases classified elsewhere: Secondary | ICD-10-CM | POA: Diagnosis not present

## 2018-10-24 DIAGNOSIS — C7951 Secondary malignant neoplasm of bone: Secondary | ICD-10-CM | POA: Diagnosis not present

## 2018-10-24 DIAGNOSIS — G893 Neoplasm related pain (acute) (chronic): Secondary | ICD-10-CM | POA: Diagnosis not present

## 2018-10-24 DIAGNOSIS — K5903 Drug induced constipation: Secondary | ICD-10-CM | POA: Diagnosis not present

## 2018-10-27 ENCOUNTER — Telehealth: Payer: Self-pay | Admitting: *Deleted

## 2018-10-27 DIAGNOSIS — R338 Other retention of urine: Secondary | ICD-10-CM | POA: Diagnosis not present

## 2018-10-27 DIAGNOSIS — N1330 Unspecified hydronephrosis: Secondary | ICD-10-CM | POA: Diagnosis not present

## 2018-10-27 DIAGNOSIS — R8279 Other abnormal findings on microbiological examination of urine: Secondary | ICD-10-CM | POA: Diagnosis not present

## 2018-10-27 NOTE — Telephone Encounter (Signed)
Reports that gradually over past week she has developed significant bilateral pitting edema in legs from knee down. Can no longer wear shoes and her pants are even getting too tight. Both legs are swollen. Saw urology today and they had no explanation for this.  Asking if they could be seen this week?

## 2018-10-28 ENCOUNTER — Encounter: Payer: Self-pay | Admitting: Nurse Practitioner

## 2018-10-28 ENCOUNTER — Inpatient Hospital Stay: Payer: Medicare Other | Attending: Nurse Practitioner | Admitting: Nurse Practitioner

## 2018-10-28 VITALS — BP 124/52 | HR 102 | Temp 98.2°F | Resp 18 | Ht 63.0 in | Wt 121.3 lb

## 2018-10-28 DIAGNOSIS — C801 Malignant (primary) neoplasm, unspecified: Secondary | ICD-10-CM

## 2018-10-28 DIAGNOSIS — R600 Localized edema: Secondary | ICD-10-CM | POA: Insufficient documentation

## 2018-10-28 DIAGNOSIS — Z86000 Personal history of in-situ neoplasm of breast: Secondary | ICD-10-CM | POA: Diagnosis not present

## 2018-10-28 DIAGNOSIS — C799 Secondary malignant neoplasm of unspecified site: Secondary | ICD-10-CM

## 2018-10-28 DIAGNOSIS — K5900 Constipation, unspecified: Secondary | ICD-10-CM | POA: Diagnosis not present

## 2018-10-28 DIAGNOSIS — M549 Dorsalgia, unspecified: Secondary | ICD-10-CM | POA: Insufficient documentation

## 2018-10-28 DIAGNOSIS — R300 Dysuria: Secondary | ICD-10-CM | POA: Diagnosis not present

## 2018-10-28 MED ORDER — OXYCODONE HCL 5 MG PO TABS: 10.0000 mg | ORAL_TABLET | ORAL | 0 refills | Status: DC | PRN

## 2018-10-28 MED ORDER — OXYCODONE HCL ER 10 MG PO T12A: 10.0000 mg | EXTENDED_RELEASE_TABLET | Freq: Two times a day (BID) | ORAL | 0 refills | Status: DC

## 2018-10-28 MED ORDER — CIPROFLOXACIN HCL 250 MG PO TABS: 250.0000 mg | ORAL_TABLET | Freq: Two times a day (BID) | ORAL | 0 refills | Status: AC

## 2018-10-28 NOTE — Progress Notes (Addendum)
[place] OFFICE PROGRESS NOTE   Diagnosis: Metastatic carcinoma unknown primary  INTERVAL HISTORY:   [name] returns prior to scheduled follow-up for evaluation of bilateral leg edema.  She reports progressive leg edema since her last office visit 10/19/2018.  The swelling is making it difficult for her to walk.  She has tried elevating her legs.  She denies calf pain.  No shortness of breath.  She is experiencing constipation.  Last bowel movement was 2-1/2 days ago.  She feels uncomfortable.  She is currently taking a stool softener and Dulcolax.  She was seen by urology yesterday and reports she was diagnosed with a UTI.  Culture result is pending.  At the time of the urology visit she was not experiencing urinary symptoms.  Last night she developed some dysuria and difficulty initiating the urine stream.  No fever.  The nausea she was experiencing at the time of her last visit has improved.  She continues to have shoulder and abdominal/back pain.  She is taking oxycodone 4-6 times a day.  She notes improvement after taking oxycodone.  Objective:  Vital signs in last 24 hours:  Blood pressure (!) 124/52, pulse (!) 102, temperature 98.2 F (36.8 C), temperature source Oral, resp. rate 18, height _0  (1.6 m), weight 121 lb 4.8 oz (55 kg), SpO2 100 %.    HEENT: No thrush or ulcers. Resp: Lungs clear bilaterally. Cardio: Regular rate and rhythm. GI: Abdomen is soft, mildly distended.  No hepatomegaly. Vascular: Pitting edema throughout the lower legs, sacrum.  Stasis change at the lower legs.  Skin: Area of erythema left mid back.   Lab Results:  Lab Results  Component Value Date   WBC 14.1 (H) 10/06/2018   HGB 10.4 (L) 10/06/2018   HCT 33.6 (L) 10/06/2018   MCV 84.6 10/06/2018   PLT 387 10/06/2018   NEUTROABS 12.2 (H) 10/06/2018    Imaging:  No results found.  Medications: I have reviewed the patient's current  medications.  Assessment/Plan: 1.Hypercalcemia-status post Zometa on 10/02/2018, improved 2.Metastatic carcinoma unknown primary; lytic bone lesionsleft subpectoral node, rectal and bladder thickening on CTs11/07/2018 and 10/03/2018  Ultrasound biopsy left subpectoral lymph node 10/05/2018- metastatic carcinoma with initial histology consistent with metastatic breast cancer; ER negative, PR negative and HER-2 negative.  Additional immunohistochemistry showed cytokeratin 903 positive and GCDFP-15 negative.  Mismatch repair protein by IHC, normal. 3.Nausea and vomiting 10/08/2018-etiology unclear, potentially related tomorphine 4.History of left breast DCIS, status post a left mastectomy in 2001 5.Altered mental status secondary to #1 6.History of Barrett's esophagus 7.Osteoporosis 8.Nephrectomy in 1955 9.Pain secondary to lytic bone lesions 10.Left neck mass 11.Anemia 12. Bilateral pleural effusions-status post a left thoracentesis 10/04/2018; initial cytology reactive mesothelial cells, mixed lymphoid population.  Additional immunostains showed cells with positive staining for pancytokeratin and GATA-3.  Disposition: [name] is an 84 year old woman with metastatic carcinoma of unknown primary.  She has elected supportive/comfort care.  An initial hospice visit is scheduled at her home tomorrow.  She had previously decided on NO CODE BLUE status.  We confirmed this at today's visit.  She has developed significant lower body edema.  She understands this is most likely due to hypoalbuminemia. We discussed the possibility of blood clots as well as cancer in the abdominal cavity. Prednisone may be contributing. She will elevate her legs and try support stockings.  We decided against a diuretic.  For pain she will begin OxyContin 10 mg every 12 hours.  She will continue  oxycodone as needed.  Prescriptions were sent to her pharmacy.  For the constipation she will continue the  stool softener and also begin daily MiraLAX.  She is experiencing symptoms of a urinary tract infection.  She will complete a course of ciprofloxacin.  She will return for follow-up as scheduled on 11/09/2018.  She will contact the office prior to that with further problems.  Patient seen with Dr. Benay Spice.  25 minutes were spent face-to-face at today's visit with the majority of that time involved in counseling/coordination of care.    [name] ANP/GNP-BC   10/28/2018  2:10 PM This was a shared visit with [name].  [name] was interviewed and examined.  We addressed multiple issues today including anasarca, pain, constipation, and a urinary tract infection.  She does not wish to undergo further evaluation to look for a primary tumor site.  She does not appear to be a candidate for immunotherapy based on the IHC testing.  She agrees to Hospice care.  She confirmed a no CODE BLUE status.  Julieanne Manson, MD

## 2018-10-28 NOTE — Telephone Encounter (Signed)
Informed her that she can be seen today at 1:45 by NP. They agree to bring her in.

## 2018-11-01 ENCOUNTER — Telehealth: Payer: Self-pay | Admitting: *Deleted

## 2018-11-01 DIAGNOSIS — C799 Secondary malignant neoplasm of unspecified site: Secondary | ICD-10-CM

## 2018-11-01 MED ORDER — OXYCODONE HCL ER 10 MG PO T12A: 10.0000 mg | EXTENDED_RELEASE_TABLET | Freq: Two times a day (BID) | ORAL | 0 refills | Status: DC

## 2018-11-01 MED ORDER — PROCHLORPERAZINE MALEATE 10 MG PO TABS: 10.0000 mg | ORAL_TABLET | Freq: Four times a day (QID) | ORAL | 0 refills | Status: DC | PRN

## 2018-11-01 MED ORDER — FENTANYL 12 MCG/HR TD PT72: 12.5000 ug | MEDICATED_PATCH | TRANSDERMAL | 0 refills | Status: DC

## 2018-11-01 NOTE — Telephone Encounter (Signed)
Confirmed with pharmacy that [facility] script was received. They do not have this strength in stock and can obtain it by tomorrow. There are a couple [facility] close by that do have this strength and they can forward the script there. Pharmacist will reach out to the family/patient on how to proceed.

## 2018-11-01 NOTE — Telephone Encounter (Addendum)
Received fax from [place] that oxycontin 10 mg was rejected by insurance carrier. Called Hospice RN and confirmed that if new script is sent in with "HOSPICE" noted it will be covered under her [facility]osp[facility] benefit. Only allowed to order 15 day supply. Notified by Thomes Dinning, RN that Hospice will not cover oxycontin. Dr. Benay Spice suggests Duragesic 12.5 mcg. He confirms this strength is covered.

## 2018-11-01 NOTE — Addendum Note (Signed)
Addended by: Tania Ade on: 11/01/2018 03:15 PM   Modules accepted: Orders

## 2018-11-04 ENCOUNTER — Telehealth: Payer: Self-pay | Admitting: Oncology

## 2018-11-04 NOTE — Telephone Encounter (Signed)
Returned call re cancelling 12/17 lab/fu. Spoke with dtr. Per dtr patient on hospice and hospice being supervised by [facility] cx appointment. Appointment cxd.

## 2018-11-08 ENCOUNTER — Telehealth: Payer: Self-pay | Admitting: *Deleted

## 2018-11-08 ENCOUNTER — Other Ambulatory Visit: Payer: Self-pay | Admitting: *Deleted

## 2018-11-08 MED ORDER — OXYCODONE HCL 10 MG PO TABS: 10.0000 mg | ORAL_TABLET | ORAL | 0 refills | Status: DC | PRN

## 2018-11-08 NOTE — Telephone Encounter (Signed)
Requesting refill on oxyir. Only has one pill left. OK per Dr. Benay Spice to change to 10 mg tablet and take every 4 hours prn. Confirmed with nurse that she uses Paediatric nurse at [facility]. Script faxed.

## 2018-11-08 NOTE — Telephone Encounter (Signed)
[name] with Hospice called "requesting refill for Oxycodone.  [name] daughter says she has one pill left.  How long will refill take?  Call me.  Return number when ready for pick up is [phone]."

## 2018-11-09 ENCOUNTER — Other Ambulatory Visit: Payer: Self-pay | Admitting: *Deleted

## 2018-11-09 ENCOUNTER — Other Ambulatory Visit: Payer: Medicare Other

## 2018-11-09 ENCOUNTER — Telehealth: Payer: Self-pay | Admitting: *Deleted

## 2018-11-09 ENCOUNTER — Ambulatory Visit: Payer: Medicare Other | Admitting: Oncology

## 2018-11-09 MED ORDER — FENTANYL 12 MCG/HR TD PT72: 12.5000 ug | MEDICATED_PATCH | TRANSDERMAL | 0 refills | Status: DC

## 2018-11-09 MED ORDER — FENTANYL 25 MCG/HR TD PT72: 25.0000 ug | MEDICATED_PATCH | TRANSDERMAL | 0 refills | Status: DC

## 2018-11-09 MED ORDER — ACETAMINOPHEN 325 MG PO TABS: 650.0000 mg | ORAL_TABLET | Freq: Four times a day (QID) | ORAL | 0 refills | Status: AC | PRN

## 2018-11-09 NOTE — Progress Notes (Signed)
Hospice called to request refills on Tylenol and Duragesic. Duragesic faxed to [place].

## 2018-11-09 NOTE — Telephone Encounter (Signed)
"  [name] RN, [facility] [phone]) calling from [facility] to report patient pain is six out of ten at rest.  Pain is greater than ten with movement or standing.  Wearing Fentanyl 12 mcg and Oxycodone 10 mg every four hours but still in pain. Two loose stools Monday and today and may need imodium but says she does not want to get constipated again.   Also having pain to bottom.  Shear to buttocks is now 3 cm.   Eating just bites and doing 'fair' with hydration.  Saying she's "really ready to get away from here." Daughter reports using dulcolax and senokot so I will advise to hold these."

## 2018-11-09 NOTE — Telephone Encounter (Signed)
Verbal order received and read back from Dr. Benay Spice to place two Fentanyl for 25 mcg dose.  No changes with breakthrough Oxycodone.  Order given to [name] RN at this time.    FYI [name] left voicemail with more information.  "Let Dr. Benay Spice know S[facility] Gl[facility] has constant nausea with movement.  Uses Prochlorperazine 10 mg every six hours and Reglan every six hours.  No vomiting.  Don't think there's anything we can do about it.  Needs Fentanyl refill.  Placing two at this time leaves one.

## 2018-11-10 ENCOUNTER — Telehealth: Payer: Self-pay | Admitting: *Deleted

## 2018-11-10 NOTE — Telephone Encounter (Signed)
Received TC from Thomes Dinning, RN. He is asking if prescription for fentanyl patch refill was done. The patient family states the pharmacy told them they did not receive prescription yesterday.  TCT [facility] pharmacy and spoke with pharmacy associate. She states they do have the prescription but were unclear on directions. Advised her that the prescription is as written 25 mcg patch-change patch every 3 days.  Pharm. Assoc voiced understanding, ran it through insurance and states it went through and they will prepare the prescription.  TCT Thomes Dinning, RN  And informed him the the prescription will be ready today and reminded him that it is 25 mcg patch (not 2 12.5 mcg patches as family seemed to expect)  [name] states he will be with patient on Friday when new patch is to be put on and reinforce prescription directions with family/patient.  No further questions or concerns.

## 2018-11-15 ENCOUNTER — Telehealth: Payer: Self-pay | Admitting: *Deleted

## 2018-11-15 MED ORDER — METOCLOPRAMIDE HCL 5 MG PO TABS: 5.0000 mg | ORAL_TABLET | Freq: Four times a day (QID) | ORAL | 3 refills | Status: AC | PRN

## 2018-11-15 NOTE — Telephone Encounter (Signed)
"  Thomes Dinning RN [phone]) requesting refill today or tomorrow for [name] Reglan 5 mg take 1 every six hours used as needed.  Currently uses three to four daily.  Call me to notify order sent."

## 2018-11-15 NOTE — Telephone Encounter (Signed)
Needs refill on reglan 5 mg every 6 hours prn (14 day supply at a time).

## 2018-11-17 LAB — ACID FAST CULTURE WITH REFLEXED SENSITIVITIES (MYCOBACTERIA): Acid Fast Culture: NEGATIVE

## 2018-11-26 ENCOUNTER — Telehealth: Payer: Self-pay | Admitting: *Deleted

## 2018-11-26 ENCOUNTER — Other Ambulatory Visit: Payer: Self-pay | Admitting: Nurse Practitioner

## 2018-11-26 DIAGNOSIS — C799 Secondary malignant neoplasm of unspecified site: Secondary | ICD-10-CM

## 2018-11-26 MED ORDER — OXYCODONE HCL 10 MG PO TABS: 10.0000 mg | ORAL_TABLET | ORAL | 0 refills | Status: AC | PRN

## 2018-11-26 NOTE — Telephone Encounter (Signed)
Thomes Dinning RN, [facility] [phone]) called requesting refill for Oxycodone 10 mg, take 1 tab q4 hrs prn, qty 75.  Last ordered 11-08-2018.  Return call if any questions.

## 2018-11-29 ENCOUNTER — Other Ambulatory Visit: Payer: Self-pay | Admitting: Oncology

## 2018-11-29 ENCOUNTER — Telehealth: Payer: Self-pay | Admitting: *Deleted

## 2018-11-29 MED ORDER — FENTANYL 25 MCG/HR TD PT72: 25.0000 ug | MEDICATED_PATCH | TRANSDERMAL | 0 refills | Status: AC

## 2018-11-29 NOTE — Telephone Encounter (Signed)
Needs refill on Duragesic patch 25 mcg to [facility] on [address]. MD notified.

## 2018-11-30 ENCOUNTER — Other Ambulatory Visit: Payer: Self-pay | Admitting: *Deleted

## 2018-11-30 ENCOUNTER — Telehealth: Payer: Self-pay | Admitting: *Deleted

## 2018-11-30 MED ORDER — PROCHLORPERAZINE MALEATE 10 MG PO TABS: 10.0000 mg | ORAL_TABLET | Freq: Four times a day (QID) | ORAL | 2 refills | Status: DC | PRN

## 2018-11-30 MED ORDER — BISACODYL 5 MG PO TBEC: 10.0000 mg | DELAYED_RELEASE_TABLET | Freq: Every day | ORAL | 2 refills | Status: AC | PRN

## 2018-11-30 MED ORDER — PROCHLORPERAZINE MALEATE 10 MG PO TABS: 10.0000 mg | ORAL_TABLET | Freq: Four times a day (QID) | ORAL | 2 refills | Status: AC | PRN

## 2018-11-30 NOTE — Telephone Encounter (Signed)
Call to request refills on prochlorperazine for SL/PR administration and Dulcolax 5 mg #2 every hs prn constipation. Weaker than last week, eating bites. BLE swelling and they are soft and weeping. Small puncture wound at thigh from accidental puncture with scissors. Area has been bandaged. V/S= 136/64 pulse 108, resp. 18 and sat is 98% on 2 L/min.

## 2018-12-13 ENCOUNTER — Encounter: Payer: Self-pay | Admitting: *Deleted

## 2018-12-13 NOTE — Progress Notes (Signed)
Faxed message from [facility] that patient died on 01/02/2019 at 1324. Dr. Benay Spice notified.

## 2018-12-25 DEATH — deceased
